# Patient Record
Sex: Male | Born: 1960 | Race: Black or African American | Hispanic: No | Marital: Married | Smoking: Former smoker
Health system: Southern US, Community
[De-identification: ages and names within clinical notes are randomized; demographics above are authoritative.]

## PROBLEM LIST (undated history)

## (undated) DIAGNOSIS — M1990 Unspecified osteoarthritis, unspecified site: Secondary | ICD-10-CM

## (undated) DIAGNOSIS — I10 Essential (primary) hypertension: Secondary | ICD-10-CM

## (undated) DIAGNOSIS — K219 Gastro-esophageal reflux disease without esophagitis: Secondary | ICD-10-CM

## (undated) DIAGNOSIS — G4730 Sleep apnea, unspecified: Secondary | ICD-10-CM

## (undated) HISTORY — PX: TRANSTHORACIC ECHOCARDIOGRAM: SHX275

## (undated) HISTORY — PX: PROSTATE SURGERY: SHX751

## (undated) HISTORY — PX: JOINT REPLACEMENT: SHX530

## (undated) HISTORY — PX: HERNIA REPAIR: SHX51

## (undated) HISTORY — PX: CARDIAC CATHETERIZATION: SHX172

## (undated) HISTORY — PX: FRACTURE SURGERY: SHX138

## (undated) HISTORY — PX: CARDIOVASCULAR STRESS TEST: SHX262

---

## 2010-02-02 HISTORY — PX: COLONOSCOPY: SHX174

## 2010-04-03 DIAGNOSIS — G4730 Sleep apnea, unspecified: Secondary | ICD-10-CM

## 2010-04-03 HISTORY — DX: Sleep apnea, unspecified: G47.30

## 2011-08-07 ENCOUNTER — Other Ambulatory Visit (HOSPITAL_COMMUNITY): Payer: Self-pay | Admitting: Orthopedic Surgery

## 2011-08-07 ENCOUNTER — Ambulatory Visit (HOSPITAL_COMMUNITY)
Admission: RE | Admit: 2011-08-07 | Discharge: 2011-08-07 | Disposition: A | Discharge status: Home / Self Care | Payer: 59 | Source: Ambulatory Visit | Attending: Orthopedic Surgery | Admitting: Orthopedic Surgery

## 2011-08-07 DIAGNOSIS — Z139 Encounter for screening, unspecified: Secondary | ICD-10-CM

## 2011-08-07 DIAGNOSIS — Z1389 Encounter for screening for other disorder: Secondary | ICD-10-CM | POA: Insufficient documentation

## 2011-09-01 ENCOUNTER — Encounter (HOSPITAL_COMMUNITY): Payer: Self-pay | Admitting: Pharmacy Technician

## 2011-09-09 NOTE — Pre-Procedure Instructions (Signed)
20 Darryl Payne  09/09/2011   Your procedure is scheduled on:  Thursday August 15  Report to [facility] at 9:30 AM.  Call this number if you have problems the morning of surgery: [phone]   Remember:   Do not eat food:After Midnight.  May have clear liquids:until Midnight .  Clear liquids include soda, tea, black coffee, apple or grape juice, broth.  Take these medicines the morning of surgery with A SIP OF WATER: Nexium (esomeprazole), Protonix (pantoprazole)   Do not wear jewelry, make-up or nail polish.  Do not wear lotions, powders, or perfumes. You may wear deodorant.  Do not shave 48 hours prior to surgery. Men may shave face and neck.  Do not bring valuables to the hospital.  Contacts, dentures or bridgework may not be worn into surgery.  Leave suitcase in the car. After surgery it may be brought to your room.  For patients admitted to the hospital, checkout time is 11:00 AM the day of discharge.   Patients discharged the day of surgery will not be allowed to drive home.  Name and phone number of your driver: family  Special Instructions: Incentive Spirometry - Practice and bring it with you on the day of surgery. and CHG Shower Use Special Wash: 1/2 bottle night before surgery and 1/2 bottle morning of surgery.   Please read over the following fact sheets that you were given: Pain Booklet, Coughing and Deep Breathing and Surgical Site Infection Prevention

## 2011-09-10 ENCOUNTER — Ambulatory Visit (HOSPITAL_COMMUNITY)
Admission: RE | Admit: 2011-09-10 | Discharge: 2011-09-10 | Disposition: A | Discharge status: Home / Self Care | Payer: 59 | Source: Ambulatory Visit | Attending: Orthopedic Surgery | Admitting: Orthopedic Surgery

## 2011-09-10 ENCOUNTER — Encounter (HOSPITAL_COMMUNITY)
Admission: RE | Admit: 2011-09-10 | Discharge: 2011-09-10 | Disposition: A | Discharge status: Home / Self Care | Payer: 59 | Source: Ambulatory Visit | Attending: Orthopedic Surgery | Admitting: Orthopedic Surgery

## 2011-09-10 ENCOUNTER — Encounter (HOSPITAL_COMMUNITY): Payer: Self-pay

## 2011-09-10 DIAGNOSIS — Z01812 Encounter for preprocedural laboratory examination: Secondary | ICD-10-CM | POA: Insufficient documentation

## 2011-09-10 DIAGNOSIS — Z01810 Encounter for preprocedural cardiovascular examination: Secondary | ICD-10-CM | POA: Insufficient documentation

## 2011-09-10 DIAGNOSIS — Z01818 Encounter for other preprocedural examination: Secondary | ICD-10-CM | POA: Insufficient documentation

## 2011-09-10 HISTORY — DX: Unspecified osteoarthritis, unspecified site: M19.90

## 2011-09-10 HISTORY — DX: Sleep apnea, unspecified: G47.30

## 2011-09-10 HISTORY — DX: Gastro-esophageal reflux disease without esophagitis: K21.9

## 2011-09-10 HISTORY — DX: Essential (primary) hypertension: I10

## 2011-09-10 LAB — BASIC METABOLIC PANEL
BUN: 11 mg/dL (ref 6–23)
CO2: 31 mEq/L (ref 19–32)
Calcium: 9.4 mg/dL (ref 8.4–10.5)
Chloride: 102 mEq/L (ref 96–112)
Creatinine, Ser: 1.07 mg/dL (ref 0.50–1.35)
GFR calc Af Amer: >90 mL/min (ref >90)
GFR calc non Af Amer: 78 mL/min — ABNORMAL LOW (ref >90)
Glucose, Bld: 94 mg/dL (ref 70–99)
Potassium: 4 mEq/L (ref 3.5–5.1)
Sodium: 141 mEq/L (ref 135–145)

## 2011-09-10 LAB — CBC
HCT: 42 % (ref 39.0–52.0)
Hemoglobin: 15.2 g/dL (ref 13.0–17.0)
MCH: 30.5 pg (ref 26.0–34.0)
MCHC: 36.2 g/dL — ABNORMAL HIGH (ref 30.0–36.0)
MCV: 84.2 fL (ref 78.0–100.0)
Platelets: 205 10*3/uL (ref 150–400)
RBC: 4.99 MIL/uL (ref 4.22–5.81)
RDW: 13.5 % (ref 11.5–15.5)
WBC: 9 10*3/uL (ref 4.0–10.5)

## 2011-09-10 LAB — SURGICAL PCR SCREEN
MRSA, PCR: NEGATIVE
Staphylococcus aureus: POSITIVE — AB

## 2011-09-10 NOTE — Progress Notes (Addendum)
Pt states had cardiac work-up ~1.5 years ago as result of being placed on too many medications. Denies MI or other cardiac hx prior to this. Requested records from [facility] in [place]: EKG, stress test, echo, cardiac cath. Also had sleep study, cannot tolerate CPAP machine.   Received back echo, cardiac cath, EKG, sleep study. No stress test received. Will leave for review by anesthesia.

## 2011-09-11 NOTE — Consult Note (Signed)
Anesthesia Chart Review:  Patient is a 51 year old male scheduled for left knee arthroscopy with debridement on 09/17/2011 by Dr. Rennis Chris. History includes hypertension, GERD, OSA, and arthritis, former smoker, prostate surgery (laser).  He had normal coronaries by cath in 2010 at [facility]) in [place], [place].  Labs acceptable.  Chest x-ray on 09/10/2011 showed no active disease.  EKG on 09/10/2011 showed sinus rhythm, cannot rule out anterior infarct (age undetermined).  He has low r waves in inferior leads III, aVF.  Overall, I think he EKG is stable since May 2012 [facility]).  Echo on 04/24/10 [facility]) showed normal LV systolic function, estimated EF 65-70%, mild LVH with grade 1 diastolic dysfunction, mildly dilated left atrium, mildly dilated right ventricle and right atrium with normal right ventricular contraction.  Cardiac cath on 03/19/08 [facility]) showed normal coronaries, normal left ventricular function, EF 60%, no significant gradient across the aortic valve.  Anticipate he can proceed as planned.  Shonna Chock, PA-C

## 2011-09-16 MED ORDER — CEFAZOLIN SODIUM-DEXTROSE 2-3 GM-% IV SOLR
2.0000 g | INTRAVENOUS | Status: DC
  Filled 2011-09-16: qty 50

## 2011-09-16 MED ORDER — CHLORHEXIDINE GLUCONATE 4 % EX LIQD: 60.0000 mL | Freq: Once | CUTANEOUS | Status: DC

## 2011-09-16 MED ORDER — LACTATED RINGERS IV SOLN
INTRAVENOUS | Status: DC
  Administered 2011-09-17: 11:00:00 via INTRAVENOUS

## 2011-09-17 ENCOUNTER — Ambulatory Visit (HOSPITAL_COMMUNITY): Payer: PRIVATE HEALTH INSURANCE | Admitting: Vascular Surgery

## 2011-09-17 ENCOUNTER — Ambulatory Visit (HOSPITAL_COMMUNITY)
Admission: RE | Admit: 2011-09-17 | Discharge: 2011-09-17 | Disposition: A | Discharge status: Home / Self Care | Payer: PRIVATE HEALTH INSURANCE | Source: Ambulatory Visit | Attending: Orthopedic Surgery | Admitting: Orthopedic Surgery

## 2011-09-17 ENCOUNTER — Encounter (HOSPITAL_COMMUNITY): Payer: Self-pay | Admitting: Vascular Surgery

## 2011-09-17 ENCOUNTER — Encounter (HOSPITAL_COMMUNITY)
Admission: RE | Disposition: A | Discharge status: Home / Self Care | Payer: Self-pay | Source: Ambulatory Visit | Attending: Orthopedic Surgery

## 2011-09-17 ENCOUNTER — Encounter (HOSPITAL_COMMUNITY): Payer: Self-pay | Admitting: *Deleted

## 2011-09-17 DIAGNOSIS — K219 Gastro-esophageal reflux disease without esophagitis: Secondary | ICD-10-CM | POA: Insufficient documentation

## 2011-09-17 DIAGNOSIS — M23305 Other meniscus derangements, unspecified medial meniscus, unspecified knee: Secondary | ICD-10-CM | POA: Insufficient documentation

## 2011-09-17 DIAGNOSIS — M2340 Loose body in knee, unspecified knee: Secondary | ICD-10-CM | POA: Insufficient documentation

## 2011-09-17 DIAGNOSIS — I10 Essential (primary) hypertension: Secondary | ICD-10-CM | POA: Insufficient documentation

## 2011-09-17 DIAGNOSIS — M2240 Chondromalacia patellae, unspecified knee: Secondary | ICD-10-CM | POA: Insufficient documentation

## 2011-09-17 DIAGNOSIS — G4730 Sleep apnea, unspecified: Secondary | ICD-10-CM | POA: Insufficient documentation

## 2011-09-17 DIAGNOSIS — M23302 Other meniscus derangements, unspecified lateral meniscus, unspecified knee: Secondary | ICD-10-CM | POA: Insufficient documentation

## 2011-09-17 HISTORY — PX: KNEE ARTHROSCOPY: SHX127

## 2011-09-17 SURGERY — ARTHROSCOPY, KNEE
Anesthesia: General | Site: Knee | Laterality: Left | Wound class: Clean

## 2011-09-17 MED ORDER — LIDOCAINE HCL (CARDIAC) 20 MG/ML IV SOLN: INTRAVENOUS | Status: DC | PRN

## 2011-09-17 MED ORDER — FENTANYL CITRATE 0.05 MG/ML IJ SOLN: 50.0000 ug | INTRAMUSCULAR | Status: DC | PRN

## 2011-09-17 MED ORDER — PROMETHAZINE HCL 25 MG/ML IJ SOLN: 6.2500 mg | INTRAMUSCULAR | Status: DC | PRN

## 2011-09-17 MED ORDER — FENTANYL CITRATE 0.05 MG/ML IJ SOLN
INTRAMUSCULAR | Status: DC | PRN
  Administered 2011-09-17: 100 ug via INTRAVENOUS
  Administered 2011-09-17: 25 ug via INTRAVENOUS

## 2011-09-17 MED ORDER — LACTATED RINGERS IV SOLN
INTRAVENOUS | Status: DC | PRN
  Administered 2011-09-17: 12:00:00 via INTRAVENOUS

## 2011-09-17 MED ORDER — CEFAZOLIN SODIUM-DEXTROSE 2-3 GM-% IV SOLR
INTRAVENOUS | Status: DC | PRN
  Administered 2011-09-17: 2 g via INTRAVENOUS

## 2011-09-17 MED ORDER — MIDAZOLAM HCL 2 MG/2ML IJ SOLN: 1.0000 mg | INTRAMUSCULAR | Status: DC | PRN

## 2011-09-17 MED ORDER — ONDANSETRON HCL 4 MG/2ML IJ SOLN
INTRAMUSCULAR | Status: DC | PRN
  Administered 2011-09-17: 4 mg via INTRAVENOUS

## 2011-09-17 MED ORDER — HYDROMORPHONE HCL PF 1 MG/ML IJ SOLN
0.2500 mg | INTRAMUSCULAR | Status: DC | PRN
  Administered 2011-09-17 (×2): 0.5 mg via INTRAVENOUS

## 2011-09-17 MED ORDER — OXYCODONE-ACETAMINOPHEN 5-325 MG PO TABS: 1.0000 | ORAL_TABLET | ORAL | Status: AC | PRN

## 2011-09-17 MED ORDER — SODIUM CHLORIDE 0.9 % IR SOLN
Status: DC | PRN
  Administered 2011-09-17: 3000 mL

## 2011-09-17 MED ORDER — PROPOFOL 10 MG/ML IV EMUL
INTRAVENOUS | Status: DC | PRN
  Administered 2011-09-17: 250 mg via INTRAVENOUS
  Administered 2011-09-17: 40 mg via INTRAVENOUS

## 2011-09-17 MED ORDER — MIDAZOLAM HCL 5 MG/5ML IJ SOLN
INTRAMUSCULAR | Status: DC | PRN
  Administered 2011-09-17: 2 mg via INTRAVENOUS

## 2011-09-17 MED ORDER — BUPIVACAINE HCL (PF) 0.25 % IJ SOLN
INTRAMUSCULAR | Status: DC | PRN
  Administered 2011-09-17: 30 mL

## 2011-09-17 MED ORDER — LIDOCAINE HCL (CARDIAC) 20 MG/ML IV SOLN
INTRAVENOUS | Status: DC | PRN
  Administered 2011-09-17: 100 mg via INTRAVENOUS

## 2011-09-17 MED ORDER — HYDROMORPHONE HCL PF 1 MG/ML IJ SOLN
INTRAMUSCULAR | Status: AC
  Filled 2011-09-17: qty 1

## 2011-09-17 MED ORDER — BUPIVACAINE HCL (PF) 0.25 % IJ SOLN
INTRAMUSCULAR | Status: AC
  Filled 2011-09-17: qty 30

## 2011-09-17 SURGICAL SUPPLY — 36 items
BANDAGE ELASTIC 6 VELCRO ST LF (GAUZE/BANDAGES/DRESSINGS) ×2 IMPLANT
BLADE CUDA 5.5 (BLADE) IMPLANT
BLADE CUTTER GATOR 3.5 (BLADE) ×2 IMPLANT
BLADE GREAT WHITE 4.2 (BLADE) ×2 IMPLANT
BOOTCOVER CLEANROOM LRG (PROTECTIVE WEAR) ×8 IMPLANT
CLOTH BEACON ORANGE TIMEOUT ST (SAFETY) ×2 IMPLANT
CLSR STERI-STRIP ANTIMIC 1/2X4 (GAUZE/BANDAGES/DRESSINGS) ×2 IMPLANT
DRAPE ARTHROSCOPY W/POUCH 114 (DRAPES) ×2 IMPLANT
DRSG PAD ABDOMINAL 8X10 ST (GAUZE/BANDAGES/DRESSINGS) IMPLANT
DURAPREP 26ML APPLICATOR (WOUND CARE) ×2 IMPLANT
GLOVE BIO SURGEON STRL SZ7.5 (GLOVE) ×2 IMPLANT
GLOVE BIO SURGEON STRL SZ8 (GLOVE) ×2 IMPLANT
GLOVE BIOGEL PI IND STRL 8.5 (GLOVE) ×2 IMPLANT
GLOVE BIOGEL PI INDICATOR 8.5 (GLOVE) ×2
GLOVE EUDERMIC 7 POWDERFREE (GLOVE) ×2 IMPLANT
GLOVE SS BIOGEL STRL SZ 7.5 (GLOVE) ×1 IMPLANT
GLOVE SUPERSENSE BIOGEL SZ 7.5 (GLOVE) ×1
GOWN STRL NON-REIN LRG LVL3 (GOWN DISPOSABLE) ×2 IMPLANT
GOWN STRL REIN XL XLG (GOWN DISPOSABLE) ×4 IMPLANT
KIT BASIN OR (CUSTOM PROCEDURE TRAY) ×2 IMPLANT
KIT ROOM TURNOVER OR (KITS) ×2 IMPLANT
MANIFOLD NEPTUNE II (INSTRUMENTS) ×2 IMPLANT
NEEDLE 18GX1X1/2 (RX/OR ONLY) (NEEDLE) ×2 IMPLANT
PACK ARTHROSCOPY DSU (CUSTOM PROCEDURE TRAY) ×2 IMPLANT
PAD ARMBOARD 7.5X6 YLW CONV (MISCELLANEOUS) ×4 IMPLANT
PADDING CAST COTTON 6X4 STRL (CAST SUPPLIES) IMPLANT
RING FOAM WHITE (MISCELLANEOUS) ×2 IMPLANT
SCOTCHCAST PLUS 4X4 WHITE (CAST SUPPLIES) ×2 IMPLANT
SET ARTHROSCOPY TUBING (MISCELLANEOUS) ×1
SET ARTHROSCOPY TUBING LN (MISCELLANEOUS) ×1 IMPLANT
SPONGE GAUZE 4X4 12PLY (GAUZE/BANDAGES/DRESSINGS) ×2 IMPLANT
SPONGE LAP 4X18 X RAY DECT (DISPOSABLE) IMPLANT
STRIP CLOSURE SKIN 1/2X4 (GAUZE/BANDAGES/DRESSINGS) ×2 IMPLANT
SYR 30ML LL (SYRINGE) ×2 IMPLANT
TOWEL OR 17X24 6PK STRL BLUE (TOWEL DISPOSABLE) ×2 IMPLANT
WATER STERILE IRR 1000ML POUR (IV SOLUTION) ×2 IMPLANT

## 2011-09-17 NOTE — Op Note (Signed)
09/17/2011  1:21 PM  PATIENT:   Darryl Payne  51 y.o. male  PRE-OPERATIVE DIAGNOSIS:  LEFT MEDIAL MENISCAL TEAR  POST-OPERATIVE DIAGNOSIS:  Left knee chondromalacia, M and L meniscal tears, loose bodies  PROCEDURE:  LKA, partial M and L menisectomies, chondroplasty, LB removal  SURGEON:  Isidor Bromell, Vania Rea M.D.  ASSISTANTS: Shuford pac   ANESTHESIA:   LMA + local  EBL: min  SPECIMEN:  none  Drains: none   PATIENT DISPOSITION:  PACU - hemodynamically stable.    PLAN OF CARE: Discharge to home after PACU  Dictation# [id number]

## 2011-09-17 NOTE — Anesthesia Preprocedure Evaluation (Addendum)
Anesthesia Evaluation  Patient identified by MRN, date of birth, ID band Patient awake    Reviewed: Allergy & Precautions, H&P , NPO status , Patient's Chart, lab work & pertinent test results  Airway Mallampati: II TM Distance: >3 FB Neck ROM: Full    Dental  (+) Teeth Intact   Pulmonary sleep apnea ,    Pulmonary exam normal       Cardiovascular hypertension,     Neuro/Psych    GI/Hepatic GERD-  Controlled and Medicated,  Endo/Other    Renal/GU      Musculoskeletal   Abdominal (+) + obese,   Peds  Hematology   Anesthesia Other Findings   Reproductive/Obstetrics                          Anesthesia Physical Anesthesia Plan  ASA: II  Anesthesia Plan: General   Post-op Pain Management:    Induction: Intravenous  Airway Management Planned: LMA  Additional Equipment:   Intra-op Plan:   Post-operative Plan: Extubation in OR  Informed Consent: I have reviewed the patients History and Physical, chart, labs and discussed the procedure including the risks, benefits and alternatives for the proposed anesthesia with the patient or authorized representative who has indicated his/her understanding and acceptance.     Plan Discussed with: CRNA and Surgeon  Anesthesia Plan Comments:         Anesthesia Quick Evaluation

## 2011-09-17 NOTE — Anesthesia Postprocedure Evaluation (Signed)
  Anesthesia Post-op Note  Patient: Darryl Payne  Procedure(s) Performed: Procedure(s) (LRB): ARTHROSCOPY KNEE (Left)  Patient Location: PACU  Anesthesia Type: General  Level of Consciousness: awake  Airway and Oxygen Therapy: Patient Spontanous Breathing  Post-op Pain: mild  Post-op Assessment: Post-op Vital signs reviewed, Patient's Cardiovascular Status Stable, Respiratory Function Stable, Patent Airway, No signs of Nausea or vomiting and Pain level controlled  Post-op Vital Signs: stable  Complications: No apparent anesthesia complications

## 2011-09-17 NOTE — H&P (Signed)
Dierdre Harness    Chief Complaint: LEFT MEDIAL MENISCAL TEAR HPI: The patient is a 51 y.o. male with persistent left knee pain refractory to conservative Rx.  Past Medical History  Diagnosis Date  . Hypertension   . Sleep apnea 04/2010    sleep study [facility], not using CPAP  . GERD (gastroesophageal reflux disease)   . Arthritis     Past Surgical History  Procedure Date  . Prostate surgery     laser   . Fracture surgery 51 yo    left   . Cardiac catheterization   . Transthoracic echocardiogram   . Cardiovascular stress test     History reviewed. No pertinent family history.  Social History:  reports that he has quit smoking. He does not have any smokeless tobacco history on file. He reports that he does not drink alcohol or use illicit drugs.  Allergies: No Known Allergies  Medications Prior to Admission  Medication Sig Dispense Refill  . Amlodipine-Valsartan-HCTZ (EXFORGE HCT) 10-320-25 MG TABS Take 1 tablet by mouth daily.      . Cholecalciferol (VITAMIN D3) 5000 UNITS TABS Take 1 tablet by mouth daily.      Marland Kitchen esomeprazole (NEXIUM) 40 MG capsule Take 40 mg by mouth daily.      . fish oil-omega-3 fatty acids 1000 MG capsule Take 1 g by mouth daily.      Marland Kitchen glucosamine-chondroitin 500-400 MG tablet Take 1 tablet by mouth daily.      Marland Kitchen ibuprofen (ADVIL,MOTRIN) 800 MG tablet Take 800 mg by mouth every 8 (eight) hours as needed.      . pantoprazole (PROTONIX) 40 MG tablet Take 40 mg by mouth daily.         Physical Exam: le5ft knee pain with exam as documented at 08/21/11 office visit  Vitals  Temp:  [97.5 F (36.4 C)] 97.5 F (36.4 C) (08/15 0853) Pulse Rate:  [76] 76  (08/15 0853) Resp:  [20] 20  (08/15 0853) BP: (136)/(85) 136/85 mmHg (08/15 0853) SpO2:  [98 %] 98 % (08/15 0853)  Assessment/Plan  Impression: LEFT MEDIAL MENISCAL TEAR  Plan of Action: Procedure(s): ARTHROSCOPY KNEE with debridement  Ailanie Ruttan M 09/17/2011, 11:52  AM

## 2011-09-17 NOTE — Transfer of Care (Signed)
Immediate Anesthesia Transfer of Care Note  Patient: Darryl Payne  Procedure(s) Performed: Procedure(s) (LRB): ARTHROSCOPY KNEE (Left)  Patient Location: PACU  Anesthesia Type: General  Level of Consciousness: sedated  Airway & Oxygen Therapy: Patient Spontanous Breathing and Patient connected to nasal cannula oxygen  Post-op Assessment: Report given to PACU RN and Post -op Vital signs reviewed and stable  Post vital signs: Reviewed and stable  Complications: No apparent anesthesia complications

## 2011-09-17 NOTE — Progress Notes (Signed)
Orthopedic Tech Progress Note Patient Details:  Darryl Payne 1960-08-30 [id number]  Ortho Devices Type of Ortho Device: Crutches Ortho Device/Splint Interventions: Application   Shawnie Pons 09/17/2011, 2:46 PM

## 2011-09-18 ENCOUNTER — Encounter (HOSPITAL_COMMUNITY): Payer: Self-pay | Admitting: Orthopedic Surgery

## 2011-09-18 NOTE — Op Note (Signed)
NAME:  Darryl Payne, Darryl Payne NO.:  [id number]  MEDICAL RECORD NO.:  [id number]  LOCATION:  [facility]                         FACILITY:  [facility]  PHYSICIAN:  Vania Rea. Kloey Cazarez, M.D.  DATE OF BIRTH:  Jun 27, 1960  DATE OF PROCEDURE:  09/17/2011 DATE OF DISCHARGE:  09/17/2011                              OPERATIVE REPORT   PREOPERATIVE DIAGNOSIS:  Left knee medial meniscus tear.  POSTOPERATIVE DIAGNOSES: 1. Left knee medial meniscus tear. 2. Left knee lateral meniscus tear. 3. 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 4. Multiple cartilaginous loose bodies.  PROCEDURE: 1. Left knee diagnostic arthroscopy. 2. Partial medial and partial lateral meniscectomies. 3. Chondroplasty of the patella. 4. Chondroplasty of the trochlear groove. 5. Chondroplasty of the medial femoral condyle. 6. Removal of multiple cartilaginous loose bodies.  SURGEON:  Vania Rea. Dereon Williamsen, M.D.  Threasa HeadsFrench Ana A. Shuford, P.A.-C.  ANESTHESIA:  LMA as well as local.  BLOOD LOSS:  Minimal.  DRAINS:  None.  TOURNIQUET:  None.  HISTORY:  Darryl Payne is a 51 year old gentleman who has had persistent left knee pain, swelling, mechanical symptoms that are refractory to prolonged attempts at conservative management.  Recent MRI scan shows evidence for tricompartmental degenerative changes, particularly in the medial compartment as well as evidence for medial meniscal tear.  Due to his ongoing pain, swelling, mechanical symptoms, and failure to respond to conservative management, he is brought to the operating room at this time for planned left knee arthroscopy as described below.  Preoperatively I counseled Mr. Mario on treatment options as well as risks versus benefits thereof.  Possible surgical complications were reviewed including potential for bleeding, infection, neurovascular injury, DVT, PE, as well as persistent pain.  We also discussed that arthroscopic surgery would  not change underlying osteoarthritis.  He understands and accepts and agrees with our planned procedure.  PROCEDURE IN DETAIL:  After undergoing routine preop evaluation, the patient received prophylactic antibiotics.  Brought to the operating room, placed supine on the operating table, underwent smooth induction of an LMA general anesthesia.  Left leg was placed in a leg holder and sterilely prepped and draped in standard fashion.  Time-out was called. Standard arthroscopy portals were established and diagnostic arthroscopy was performed.  The suprapatellar pouch and gutters showed numerous cartilaginous loose bodies and these were all evacuated with a shaver as well through the arthroscopic cannula.  The patellofemoral joint showed grade 3 chondromalacia of central patellar facet, which was debrided with shaver to stable cartilaginous base.  The trochlear groove distally showed an area of advanced chondromalacia with several areas of exposed subchondral bone across almost the entire width of the trochlear groove and loose flaps were debrided with a shaver and this area was general smoothed with the shaver affecting the chondroplasty to the entire region.  The intercondylar notch of the ACL to be intact.  Medially, there was again grade 3+ chondromalacia of the majority of the medial femoral condyle and this was all debrided with a shaver to stable cartilaginous base and there were several areas where there appeared to be some underlying subchondral bone exposed.  There was also a complex degenerative tear involving the posterior half of the  medial meniscus and again this was trimmed back to a stable margin with a basket and shaver was used for final contouring and removal of the meniscal fragments.  We also evacuated a number of cartilaginous fragments from posteromedial and posterolateral compartments.  Laterally articular surfaces were in good condition, but the lateral meniscus  showed a degenerative tear of the mid third which was debrided with shaver back to stable base.  At this point, final inspection and irrigation was then completed.  Fluid and instruments were removed.  Combination of Marcaine was instilled in the knee joint and around the portals.  Portals was closed with Steri-Strips.  Dry dressing taped at the left knee and leg was Ace bandaged support stocking.  The patient was then awakened, extubated, and taken to the recovery room in a stable condition.     Vania Rea. Nazier Neyhart, M.D.     KMS/MEDQ  D:  09/17/2011  T:  09/18/2011  Job:  [id number]

## 2011-11-19 ENCOUNTER — Ambulatory Visit: Payer: Self-pay

## 2014-06-27 DIAGNOSIS — M19011 Primary osteoarthritis, right shoulder: Secondary | ICD-10-CM | POA: Insufficient documentation

## 2015-06-18 ENCOUNTER — Encounter: Payer: Self-pay | Admitting: Family Medicine

## 2015-06-18 ENCOUNTER — Ambulatory Visit (INDEPENDENT_AMBULATORY_CARE_PROVIDER_SITE_OTHER): Payer: 59 | Admitting: Family Medicine

## 2015-06-18 VITALS — BP 120/78 | HR 80 | Ht 69.0 in | Wt 249.0 lb

## 2015-06-18 DIAGNOSIS — M159 Polyosteoarthritis, unspecified: Secondary | ICD-10-CM

## 2015-06-18 DIAGNOSIS — M150 Primary generalized (osteo)arthritis: Secondary | ICD-10-CM | POA: Diagnosis not present

## 2015-06-18 DIAGNOSIS — I10 Essential (primary) hypertension: Secondary | ICD-10-CM

## 2015-06-18 DIAGNOSIS — K219 Gastro-esophageal reflux disease without esophagitis: Secondary | ICD-10-CM | POA: Diagnosis not present

## 2015-06-18 MED ORDER — LOSARTAN POTASSIUM 50 MG PO TABS: 50.0000 mg | ORAL_TABLET | Freq: Every day | ORAL | Status: DC

## 2015-06-18 MED ORDER — PANTOPRAZOLE SODIUM 40 MG PO TBEC: 40.0000 mg | DELAYED_RELEASE_TABLET | Freq: Every day | ORAL | Status: AC

## 2015-06-18 NOTE — Progress Notes (Signed)
Name: Darryl LenisRobert L Payne   MRN: [id number]    DOB: July 31, 1960   Date:06/18/2015       Progress Note  Subjective  Chief Complaint  Chief Complaint  Patient presents with  . Establish Care  . Hypotension    "been feeling tired, so started checking B/P 107/68- stopped taking my medicine for a couple of days and monitored blood pressure." Started taking med again after 3 days and wanted to discuss it with a doctor.    HPI Comments: Patient presents for establishment with new physician.  Hypertension This is a chronic problem. The current episode started more than 1 year ago. The problem has been gradually improving since onset. The problem is controlled. Pertinent negatives include no anxiety, blurred vision, chest pain, headaches, malaise/fatigue, neck pain, orthopnea, palpitations, peripheral edema, PND, shortness of breath or sweats. There are no associated agents to hypertension. Risk factors for coronary artery disease include obesity and dyslipidemia. Past treatments include angiotensin blockers, calcium channel blockers and diuretics. The current treatment provides mild improvement. There are no compliance problems.  There is no history of angina, kidney disease, CAD/MI, CVA, heart failure, left ventricular hypertrophy, PVD, renovascular disease or retinopathy. There is no history of chronic renal disease or a hypertension causing med.    No problem-specific assessment & plan notes found for this encounter.   Past Medical History  Diagnosis Date  . Hypertension   . Sleep apnea 04/2010    [facility], not using CPAP  . GERD (gastroesophageal reflux disease)   . Arthritis     Past Surgical History  Procedure Laterality Date  . Prostate surgery      laser   . Fracture surgery  55 yo    left ankle  . Cardiac catheterization    . Transthoracic echocardiogram    . Cardiovascular stress test    . Knee arthroscopy  09/17/2011    Procedure: ARTHROSCOPY KNEE;   Surgeon: Senaida LangeKevin M Supple, MD;  Location: [facility] OR;  Service: Orthopedics;  Laterality: Left;  with Medial meniscectomy and debridement  . Colonoscopy  02/02/2010    normal/ repeat in 10 yrs- [facility]    Family History  Problem Relation Age of Onset  . Diabetes Father   . Cancer Sister   . Cancer Brother   . Diabetes Brother     Social History   Social History  . Marital Status: Married    Spouse Name: N/A  . Number of Children: N/A  . Years of Education: N/A   Occupational History  . Not on file.   Social History Main Topics  . Smoking status: Former Games developermoker  . Smokeless tobacco: Not on file  . Alcohol Use: No  . Drug Use: No  . Sexual Activity: Yes   Other Topics Concern  . Not on file   Social History Narrative    No Known Allergies   Review of Systems  Constitutional: Negative for fever, chills, weight loss and malaise/fatigue.  HENT: Negative for ear discharge, ear pain and sore throat.   Eyes: Negative for blurred vision.  Respiratory: Negative for cough, sputum production, shortness of breath and wheezing.   Cardiovascular: Negative for chest pain, palpitations, orthopnea, leg swelling and PND.  Gastrointestinal: Negative for heartburn, nausea, abdominal pain, diarrhea, constipation, blood in stool and melena.  Genitourinary: Negative for dysuria, urgency, frequency and hematuria.  Musculoskeletal: Negative for myalgias, back pain, joint pain and neck pain.  Skin: Negative for rash.  Neurological: Negative for  dizziness, tingling, sensory change, focal weakness and headaches.  Endo/Heme/Allergies: Negative for environmental allergies and polydipsia. Does not bruise/bleed easily.  Psychiatric/Behavioral: Negative for depression and suicidal ideas. The patient is not nervous/anxious and does not have insomnia.      Objective  Filed Vitals:   06/18/15 1434  BP: 120/78  Pulse: 80  Height:  (1.753 m)  Weight: 249 lb (112.946 kg)    Physical Exam   Constitutional: He is oriented to person, place, and time and well-developed, well-nourished, and in no distress.  HENT:  Head: Normocephalic.  Right Ear: External ear normal.  Left Ear: External ear normal.  Nose: Nose normal.  Mouth/Throat: Oropharynx is clear and moist.  Eyes: Conjunctivae and EOM are normal. Pupils are equal, round, and reactive to light. Right eye exhibits no discharge. Left eye exhibits no discharge. No scleral icterus.  Neck: Normal range of motion. Neck supple. No JVD present. No tracheal deviation present. No thyromegaly present.  Cardiovascular: Normal rate, regular rhythm, normal heart sounds and intact distal pulses.  Exam reveals no gallop and no friction rub.   No murmur heard. Pulmonary/Chest: Breath sounds normal. No respiratory distress. He has no wheezes. He has no rales.  Abdominal: Soft. Bowel sounds are normal. He exhibits no mass. There is no hepatosplenomegaly. There is no tenderness. There is no rebound, no guarding and no CVA tenderness.  Musculoskeletal: Normal range of motion. He exhibits no edema or tenderness.  Lymphadenopathy:    He has no cervical adenopathy.  Neurological: He is alert and oriented to person, place, and time. He has normal sensation, normal strength and intact cranial nerves. No cranial nerve deficit.  Skin: Skin is warm. No rash noted.  Psychiatric: Mood and affect normal.  Nursing note and vitals reviewed.     Assessment & Plan  Problem List Items Addressed This Visit    None    Visit Diagnoses    Essential hypertension    -  Primary    Relevant Medications    losartan (COZAAR) 50 MG tablet    Primary osteoarthritis involving multiple joints        aleve bid    Gastroesophageal reflux disease, esophagitis presence not specified        Relevant Medications    pantoprazole (PROTONIX) 40 MG tablet         Dr. Hayden [facility]  06/18/2015

## 2015-07-25 ENCOUNTER — Other Ambulatory Visit: Payer: Self-pay

## 2015-07-25 MED ORDER — AMLODIPINE-VALSARTAN-HCTZ 10-320-25 MG PO TABS: 1.0000 | ORAL_TABLET | Freq: Every day | ORAL | Status: DC

## 2015-07-25 MED ORDER — MELOXICAM 7.5 MG PO TABS: 7.5000 mg | ORAL_TABLET | Freq: Two times a day (BID) | ORAL | Status: DC

## 2015-07-30 ENCOUNTER — Other Ambulatory Visit: Payer: Self-pay

## 2015-08-29 ENCOUNTER — Other Ambulatory Visit: Payer: Self-pay

## 2015-08-29 DIAGNOSIS — I10 Essential (primary) hypertension: Secondary | ICD-10-CM

## 2015-08-29 MED ORDER — AMLODIPINE-VALSARTAN-HCTZ 10-320-25 MG PO TABS: 1.0000 | ORAL_TABLET | Freq: Every day | ORAL | 1 refills | Status: DC

## 2015-11-01 ENCOUNTER — Other Ambulatory Visit: Payer: Self-pay

## 2015-12-19 ENCOUNTER — Encounter: Payer: 59 | Admitting: Family Medicine

## 2016-03-11 ENCOUNTER — Other Ambulatory Visit: Payer: Self-pay | Admitting: Orthopedic Surgery

## 2016-04-22 NOTE — Pre-Procedure Instructions (Signed)
Darryl LenisRobert L Payne  04/22/2016      [id number] [place] [address] [place] [id number] Phone: [phone] Fax: [phone]    Your procedure is scheduled on  Thursday  04/30/16  [facility].M[facility]  Call this number if you have problems the morning of surgery:  [phone]   Remember:  Do not eat food or drink liquids after midnight.  Take these medicines the morning of surgery with A SIP OF WATER   PANTOPRAZOLE (PROTONIX)          (STOP NOW TAKING ASPIRIN OR ASPIRIN PRODUCTS, IBUPROFEN/ ADVIL/ MOTRIN, GOODY POWDERS, BC'S, FISH OIL , FLAXSEED, GINKO BILOBA, MELOXICAM/ MOBIC, MULTIVITAMIN, HERBAL MEDICINES)   Do not wear jewelry, make-up or nail polish.  Do not wear lotions, powders, or perfumes, or deoderant.  Do not shave 48 hours prior to surgery.  Men may shave face and neck.  Do not bring valuables to the hospital.  [facility] is not [facility] for any belongings or valuables.  Contacts, dentures or bridgework may not be worn into surgery.  Leave your suitcase in the car.  After surgery it may be brought to your room.  For patients admitted to the hospital, discharge time will be determined by your treatment team.  Patients discharged the day of surgery will not be allowed to drive home.   Name and phone number of your driver:    Special instructions:  [place] - Preparing for Surgery  Before surgery, you can play an important role.  Because skin is not sterile, your skin needs to be as free of germs as possible.  You can reduce the number of germs on you skin by washing with CHG (chlorahexidine gluconate) soap before surgery.  CHG is an antiseptic cleaner which kills germs and bonds with the skin to continue killing germs even after washing.  Please DO NOT use if you have an allergy to CHG or antibacterial soaps.  If your skin becomes reddened/irritated stop using the CHG and inform  your nurse when you arrive at Short Stay.  Do not shave (including legs and underarms) for at least 48 hours prior to the first CHG shower.  You may shave your face.  Please follow these instructions carefully:   1.  Shower with CHG Soap the night before surgery and the                                morning of Surgery.  2.  If you choose to wash your hair, wash your hair first as usual with your       normal shampoo.  3.  After you shampoo, rinse your hair and body thoroughly to remove the                      Shampoo.  4.  Use CHG as you would any other liquid soap.  You can apply chg directly       to the skin and wash gently with scrungie or a clean washcloth.  5.  Apply the CHG Soap to your body ONLY FROM THE NECK DOWN.        Do not use on open wounds or open sores.  Avoid contact with your eyes,       ears, mouth and genitals (private parts).  Wash genitals (  private parts)       with your normal soap.  6.  Wash thoroughly, paying special attention to the area where your surgery        will be performed.  7.  Thoroughly rinse your body with warm water from the neck down.  8.  DO NOT shower/wash with your normal soap after using and rinsing off       the CHG Soap.  9.  Pat yourself dry with a clean towel.            10.  Wear clean pajamas.            11.  Place clean sheets on your bed the night of your first shower and do not        sleep with pets.  Day of Surgery  Do not apply any lotions/deoderants the morning of surgery.  Please wear clean clothes to the hospital/surgery center.    Please read over the following fact sheets that you were given. MRSA Information and Surgical Site Infection Prevention

## 2016-04-23 ENCOUNTER — Encounter (HOSPITAL_COMMUNITY)
Admission: RE | Admit: 2016-04-23 | Discharge: 2016-04-23 | Disposition: A | Discharge status: Home / Self Care | Payer: 59 | Source: Ambulatory Visit | Attending: Orthopedic Surgery | Admitting: Orthopedic Surgery

## 2016-04-23 ENCOUNTER — Ambulatory Visit (HOSPITAL_COMMUNITY)
Admission: RE | Admit: 2016-04-23 | Discharge: 2016-04-23 | Disposition: A | Discharge status: Home / Self Care | Payer: 59 | Source: Ambulatory Visit | Attending: Orthopedic Surgery | Admitting: Orthopedic Surgery

## 2016-04-23 ENCOUNTER — Encounter (HOSPITAL_COMMUNITY): Admission: RE | Admit: 2016-04-23 | Payer: 59 | Source: Ambulatory Visit

## 2016-04-23 ENCOUNTER — Encounter (HOSPITAL_COMMUNITY): Payer: Self-pay

## 2016-04-23 DIAGNOSIS — R918 Other nonspecific abnormal finding of lung field: Secondary | ICD-10-CM | POA: Insufficient documentation

## 2016-04-23 DIAGNOSIS — I517 Cardiomegaly: Secondary | ICD-10-CM | POA: Insufficient documentation

## 2016-04-23 DIAGNOSIS — Z01812 Encounter for preprocedural laboratory examination: Secondary | ICD-10-CM | POA: Diagnosis not present

## 2016-04-23 DIAGNOSIS — M19012 Primary osteoarthritis, left shoulder: Secondary | ICD-10-CM | POA: Insufficient documentation

## 2016-04-23 DIAGNOSIS — Z01818 Encounter for other preprocedural examination: Secondary | ICD-10-CM | POA: Diagnosis not present

## 2016-04-23 HISTORY — DX: Unspecified osteoarthritis, unspecified site: M19.90

## 2016-04-23 LAB — COMPREHENSIVE METABOLIC PANEL
ALT: 18 U/L (ref 17–63)
AST: 22 U/L (ref 15–41)
Albumin: 4 g/dL (ref 3.5–5.0)
Alkaline Phosphatase: 63 U/L (ref 38–126)
Anion gap: 8 (ref 5–15)
BUN: 15 mg/dL (ref 6–20)
CO2: 27 mmol/L (ref 22–32)
Calcium: 9.2 mg/dL (ref 8.9–10.3)
Chloride: 103 mmol/L (ref 101–111)
Creatinine, Ser: 1.03 mg/dL (ref 0.61–1.24)
GFR calc Af Amer: >60 mL/min (ref >60)
GFR calc non Af Amer: >60 mL/min (ref >60)
Glucose, Bld: 94 mg/dL (ref 65–99)
Potassium: 3.5 mmol/L (ref 3.5–5.1)
Sodium: 138 mmol/L (ref 135–145)
Total Bilirubin: 0.6 mg/dL (ref 0.3–1.2)
Total Protein: 7.9 g/dL (ref 6.5–8.1)

## 2016-04-23 LAB — CBC WITH DIFFERENTIAL/PLATELET
Basophils Absolute: 0 10*3/uL (ref 0.0–0.1)
Basophils Relative: 0 %
Eosinophils Absolute: 0.2 10*3/uL (ref 0.0–0.7)
Eosinophils Relative: 2 %
HCT: 43.9 % (ref 39.0–52.0)
Hemoglobin: 15.2 g/dL (ref 13.0–17.0)
Lymphocytes Relative: 23 %
Lymphs Abs: 2.1 10*3/uL (ref 0.7–4.0)
MCH: 29.7 pg (ref 26.0–34.0)
MCHC: 34.6 g/dL (ref 30.0–36.0)
MCV: 85.9 fL (ref 78.0–100.0)
Monocytes Absolute: 0.7 10*3/uL (ref 0.1–1.0)
Monocytes Relative: 8 %
Neutro Abs: 6.1 10*3/uL (ref 1.7–7.7)
Neutrophils Relative %: 67 %
Platelets: 193 10*3/uL (ref 150–400)
RBC: 5.11 MIL/uL (ref 4.22–5.81)
RDW: 14.2 % (ref 11.5–15.5)
WBC: 9.2 10*3/uL (ref 4.0–10.5)

## 2016-04-23 LAB — URINALYSIS, ROUTINE W REFLEX MICROSCOPIC
Bacteria, UA: NONE SEEN
Bilirubin Urine: NEGATIVE
Glucose, UA: NEGATIVE mg/dL
Ketones, ur: NEGATIVE mg/dL
Leukocytes, UA: NEGATIVE
Nitrite: NEGATIVE
Protein, ur: NEGATIVE mg/dL
Specific Gravity, Urine: 1.019 (ref 1.005–1.030)
Squamous Epithelial / LPF: NONE SEEN
pH: 5 (ref 5.0–8.0)

## 2016-04-23 LAB — PROTIME-INR
INR: 1.01
Prothrombin Time: 13.3 seconds (ref 11.4–15.2)

## 2016-04-23 LAB — APTT: aPTT: 27 seconds (ref 24–36)

## 2016-04-23 LAB — SURGICAL PCR SCREEN
MRSA, PCR: NEGATIVE
Staphylococcus aureus: POSITIVE — AB

## 2016-04-23 NOTE — Progress Notes (Signed)
Pt denies SOB, chest pain, and being under the care of a cardiologist. Pt stated that a stress test was performed > 5 years ago. Pt denies having an EKG and chest ay within the last year. Pt denies recent labs. Pt chart forwarded to anesthesia to review chest x ray.

## 2016-04-27 ENCOUNTER — Encounter (HOSPITAL_COMMUNITY): Payer: Self-pay

## 2016-04-27 NOTE — Progress Notes (Addendum)
Anesthesia chart review: Patient is a 56 year old male scheduled for left shoulder arthroplasty on 04/30/16 by Dr. Ave Filterhandler.  History includes former smoker, hypertension, GERD, arthritis, OSA (no CPAP), prostate surgery (laser), left knee arthroscopy '13. He had normal coronaries by [facility] in [place], [place]. BMI is consistent with obesity.   He reports PCP as Dr. Clovis RileyMitchell with [facility].   Meds include amlodipine-valsartan-HCTZ, vitamin D, fish oil, flaxseed oil, ginkgo biloba, Protonix, sildenafil (for ED).  BP 139/81   Pulse 66   Temp 36.4 C   Resp 20   Ht 5\' 9"  (1.753 m)   Wt 242 lb 9.6 oz (110 kg)   SpO2 97%   BMI 35.83 kg/m   EKG 04/23/2016: Normal sinus rhythm. Q waves in lead III.  Echo 04/24/10 [facility]; scanned under Media tab, Correspondence, encounter 09/17/11): Impression: 1. Normal LV systolic function, estimated EF 65-70%.  2. Mild LVH with grade 1 diastolic dysfunction, mildly dilated left atrium. 3. Mildly dilated right ventricle and right atrium with normal right ventricular contraction.  Cardiac cath 03/19/08 (Dr. Av[facility]lu[facility]Mahmoud Atieh[facility]; scanned under Media tab, Correspondence, encounter 09/17/11): Impression: 1. Normal coronaries. 2. Normal left ventricular function, EF 60%. 3. No significant gradient across the aortic valve. Recommendations: Continue with risk factor modification and medical therapy.  Sleep Study 05/24/10 [facility]; scanned under Media tab, Correspondence, encounter 09/17/11): Impression: There is overnight sleep study documented a moderate to severe obstructive sleep apnea syndrome and hypoventilation. Patient could not tolerate CPAP or BiPAP titration due to difficulty with exhalation, claustrophobia, etc.  Chest x-ray 04/23/2016: FINDINGS: Mediastinum hilar structures normal. Cardiomegaly. Mild bilateral interstitial prominence. No pleural effusion or pneumothorax. Degenerative changes thoracic  spine. IMPRESSION: Cardiomegaly. Mild bilateral interstitial prominence. Mild CHF cannot be excluded.  Preoperative labs noted. CMET, CBC, PT/PTT within normal limits. UA small hemoglobin, negative for leukocytes and nitrites.  Patient denied chest pain and SOB at PAT. I also called and spoke with him. He denied SOB, edema, recent respiratory infection. I was not asked to evaluate patient during his PAT visit, but subjectively no symptoms concerning for CHF or acute respiratory illness. If no acute changes then I anticipate that he can proceed as planned. I will route CXR report to Dr. Clovis RileyMitchell for his records. Of note, patient indicated that he did not want narcotic pain medication. I advised that he let Dr. Ave Filterhandler know. He will speak with Dr. Ave Filterhandler and his anesthesiologist on the morning of surgery.   Velna Ochsllison Zelenak, PA-C [facility]Anesthesiology Phone [phone] 04/27/2016 10:49 AM

## 2016-04-29 NOTE — Anesthesia Preprocedure Evaluation (Addendum)
Anesthesia Evaluation  Patient identified by MRN, date of birth, ID band Patient awake    Reviewed: Allergy & Precautions, NPO status , Patient's Chart, lab work & pertinent test results  History of Anesthesia Complications Negative for: history of anesthetic complications  Airway Mallampati: II   Neck ROM: Full    Dental no notable dental hx. (+) Dental Advisory Given   Pulmonary sleep apnea , former smoker,    Pulmonary exam normal        Cardiovascular hypertension, Normal cardiovascular exam     Neuro/Psych negative neurological ROS  negative psych ROS   GI/Hepatic Neg liver ROS, GERD  ,  Endo/Other  negative endocrine ROSMorbid obesity  Renal/GU negative Renal ROS     Musculoskeletal negative musculoskeletal ROS (+)   Abdominal   Peds  Hematology negative hematology ROS (+)   Anesthesia Other Findings Day of surgery medications reviewed with the patient.  Reproductive/Obstetrics                            Anesthesia Physical Anesthesia Plan  ASA: III  Anesthesia Plan: General   Post-op Pain Management: GA combined w/ Regional for post-op pain   Induction:   Airway Management Planned: Oral ETT  Additional Equipment:   Intra-op Plan:   Post-operative Plan: Extubation in OR  Informed Consent: I have reviewed the patients History and Physical, chart, labs and discussed the procedure including the risks, benefits and alternatives for the proposed anesthesia with the patient or authorized representative who has indicated his/her understanding and acceptance.   Dental advisory given  Plan Discussed with: CRNA, Anesthesiologist and Surgeon  Anesthesia Plan Comments:        Anesthesia Quick Evaluation

## 2016-04-30 ENCOUNTER — Inpatient Hospital Stay (HOSPITAL_COMMUNITY): Payer: 59 | Admitting: Anesthesiology

## 2016-04-30 ENCOUNTER — Inpatient Hospital Stay (HOSPITAL_COMMUNITY)
Admission: RE | Admit: 2016-04-30 | Discharge: 2016-05-01 | DRG: 483 | Disposition: A | Discharge status: Home / Self Care | Payer: 59 | Source: Ambulatory Visit | Attending: Orthopedic Surgery | Admitting: Orthopedic Surgery

## 2016-04-30 ENCOUNTER — Encounter (HOSPITAL_COMMUNITY): Payer: Self-pay | Admitting: Urology

## 2016-04-30 ENCOUNTER — Encounter (HOSPITAL_COMMUNITY)
Admission: RE | Disposition: A | Discharge status: Home / Self Care | Payer: Self-pay | Source: Ambulatory Visit | Attending: Orthopedic Surgery

## 2016-04-30 ENCOUNTER — Inpatient Hospital Stay (HOSPITAL_COMMUNITY): Payer: 59 | Admitting: Emergency Medicine

## 2016-04-30 ENCOUNTER — Inpatient Hospital Stay (HOSPITAL_COMMUNITY): Payer: 59

## 2016-04-30 DIAGNOSIS — I10 Essential (primary) hypertension: Secondary | ICD-10-CM | POA: Diagnosis present

## 2016-04-30 DIAGNOSIS — Z87891 Personal history of nicotine dependence: Secondary | ICD-10-CM

## 2016-04-30 DIAGNOSIS — K219 Gastro-esophageal reflux disease without esophagitis: Secondary | ICD-10-CM | POA: Diagnosis present

## 2016-04-30 DIAGNOSIS — Z885 Allergy status to narcotic agent status: Secondary | ICD-10-CM

## 2016-04-30 DIAGNOSIS — E876 Hypokalemia: Secondary | ICD-10-CM | POA: Diagnosis not present

## 2016-04-30 DIAGNOSIS — M19012 Primary osteoarthritis, left shoulder: Secondary | ICD-10-CM | POA: Diagnosis present

## 2016-04-30 DIAGNOSIS — Z96612 Presence of left artificial shoulder joint: Secondary | ICD-10-CM

## 2016-04-30 DIAGNOSIS — Z833 Family history of diabetes mellitus: Secondary | ICD-10-CM

## 2016-04-30 DIAGNOSIS — G4730 Sleep apnea, unspecified: Secondary | ICD-10-CM | POA: Diagnosis present

## 2016-04-30 DIAGNOSIS — Z79899 Other long term (current) drug therapy: Secondary | ICD-10-CM | POA: Diagnosis not present

## 2016-04-30 HISTORY — PX: TOTAL SHOULDER ARTHROPLASTY: SHX126

## 2016-04-30 SURGERY — ARTHROPLASTY, SHOULDER, TOTAL
Anesthesia: Regional | Laterality: Left

## 2016-04-30 MED ORDER — BUPIVACAINE LIPOSOME 1.3 % IJ SUSP
20.0000 mL | INTRAMUSCULAR | Status: DC
  Filled 2016-04-30: qty 20

## 2016-04-30 MED ORDER — SUCCINYLCHOLINE CHLORIDE 200 MG/10ML IV SOSY
PREFILLED_SYRINGE | INTRAVENOUS | Status: AC
  Filled 2016-04-30: qty 10

## 2016-04-30 MED ORDER — HYDROMORPHONE HCL 1 MG/ML IJ SOLN
INTRAMUSCULAR | Status: AC
  Administered 2016-04-30: 0.5 mg via INTRAVENOUS
  Filled 2016-04-30: qty 0.5

## 2016-04-30 MED ORDER — POLYETHYLENE GLYCOL 3350 17 G PO PACK: 17.0000 g | PACK | Freq: Every day | ORAL | Status: DC | PRN

## 2016-04-30 MED ORDER — ONDANSETRON HCL 4 MG/2ML IJ SOLN
4.0000 mg | Freq: Four times a day (QID) | INTRAMUSCULAR | Status: DC | PRN
  Administered 2016-04-30: 4 mg via INTRAVENOUS
  Filled 2016-04-30: qty 2

## 2016-04-30 MED ORDER — BUPIVACAINE-EPINEPHRINE (PF) 0.5% -1:200000 IJ SOLN
INTRAMUSCULAR | Status: DC | PRN
  Administered 2016-04-30: 30 mL via PERINEURAL

## 2016-04-30 MED ORDER — HYDROMORPHONE HCL 1 MG/ML IJ SOLN
INTRAMUSCULAR | Status: AC
  Filled 2016-04-30: qty 0.5

## 2016-04-30 MED ORDER — ACETAMINOPHEN 650 MG RE SUPP: 650.0000 mg | Freq: Four times a day (QID) | RECTAL | Status: DC | PRN

## 2016-04-30 MED ORDER — PHENOL 1.4 % MT LIQD: 1.0000 | OROMUCOSAL | Status: DC | PRN

## 2016-04-30 MED ORDER — AMLODIPINE-VALSARTAN-HCTZ 10-320-25 MG PO TABS: 1.0000 | ORAL_TABLET | Freq: Every day | ORAL | Status: DC

## 2016-04-30 MED ORDER — PHENYLEPHRINE HCL 10 MG/ML IJ SOLN
INTRAMUSCULAR | Status: DC | PRN
  Administered 2016-04-30: 80 ug via INTRAVENOUS
  Administered 2016-04-30: 200 ug via INTRAVENOUS
  Administered 2016-04-30: 120 ug via INTRAVENOUS

## 2016-04-30 MED ORDER — ONDANSETRON HCL 4 MG PO TABS: 4.0000 mg | ORAL_TABLET | Freq: Four times a day (QID) | ORAL | Status: DC | PRN

## 2016-04-30 MED ORDER — MIDAZOLAM HCL 2 MG/2ML IJ SOLN
INTRAMUSCULAR | Status: AC
  Filled 2016-04-30: qty 2

## 2016-04-30 MED ORDER — PROPOFOL 10 MG/ML IV BOLUS
INTRAVENOUS | Status: DC | PRN
  Administered 2016-04-30: 200 mg via INTRAVENOUS
  Administered 2016-04-30: 20 mg via INTRAVENOUS

## 2016-04-30 MED ORDER — METOCLOPRAMIDE HCL 5 MG/ML IJ SOLN
5.0000 mg | Freq: Three times a day (TID) | INTRAMUSCULAR | Status: DC | PRN
  Filled 2016-04-30: qty 2

## 2016-04-30 MED ORDER — ONDANSETRON HCL 4 MG/2ML IJ SOLN
INTRAMUSCULAR | Status: DC | PRN
  Administered 2016-04-30: 4 mg via INTRAVENOUS

## 2016-04-30 MED ORDER — MORPHINE SULFATE (PF) 2 MG/ML IV SOLN
1.0000 mg | INTRAVENOUS | Status: DC | PRN
  Administered 2016-04-30: 2 mg via INTRAVENOUS
  Filled 2016-04-30: qty 1

## 2016-04-30 MED ORDER — OXYCODONE-ACETAMINOPHEN 5-325 MG PO TABS: 1.0000 | ORAL_TABLET | ORAL | 0 refills | Status: DC | PRN

## 2016-04-30 MED ORDER — ALUM & MAG HYDROXIDE-SIMETH 200-200-20 MG/5ML PO SUSP: 30.0000 mL | ORAL | Status: DC | PRN

## 2016-04-30 MED ORDER — PHENYLEPHRINE 40 MCG/ML (10ML) SYRINGE FOR IV PUSH (FOR BLOOD PRESSURE SUPPORT)
PREFILLED_SYRINGE | INTRAVENOUS | Status: AC
  Filled 2016-04-30: qty 10

## 2016-04-30 MED ORDER — LACTATED RINGERS IV SOLN
INTRAVENOUS | Status: DC | PRN
  Administered 2016-04-30 (×2): via INTRAVENOUS

## 2016-04-30 MED ORDER — METHOCARBAMOL 500 MG PO TABS
ORAL_TABLET | ORAL | Status: AC
  Administered 2016-04-30: 500 mg via ORAL
  Filled 2016-04-30: qty 1

## 2016-04-30 MED ORDER — SODIUM CHLORIDE 0.9% FLUSH
INTRAVENOUS | Status: DC | PRN
  Administered 2016-04-30: 10 mL

## 2016-04-30 MED ORDER — ALBUTEROL SULFATE HFA 108 (90 BASE) MCG/ACT IN AERS
INHALATION_SPRAY | RESPIRATORY_TRACT | Status: DC | PRN
  Administered 2016-04-30: 2 via RESPIRATORY_TRACT

## 2016-04-30 MED ORDER — METHOCARBAMOL 1000 MG/10ML IJ SOLN
500.0000 mg | Freq: Four times a day (QID) | INTRAVENOUS | Status: DC | PRN
  Filled 2016-04-30: qty 5

## 2016-04-30 MED ORDER — FENTANYL CITRATE (PF) 250 MCG/5ML IJ SOLN
INTRAMUSCULAR | Status: AC
  Filled 2016-04-30: qty 5

## 2016-04-30 MED ORDER — OXYCODONE HCL 5 MG PO TABS
ORAL_TABLET | ORAL | Status: AC
  Administered 2016-04-30: 10 mg via ORAL
  Filled 2016-04-30: qty 2

## 2016-04-30 MED ORDER — METOCLOPRAMIDE HCL 5 MG PO TABS: 5.0000 mg | ORAL_TABLET | Freq: Three times a day (TID) | ORAL | Status: DC | PRN

## 2016-04-30 MED ORDER — ROCURONIUM BROMIDE 50 MG/5ML IV SOSY
PREFILLED_SYRINGE | INTRAVENOUS | Status: AC
  Filled 2016-04-30: qty 5

## 2016-04-30 MED ORDER — ASPIRIN EC 325 MG PO TBEC
325.0000 mg | DELAYED_RELEASE_TABLET | Freq: Every day | ORAL | Status: DC
  Administered 2016-04-30 – 2016-05-01 (×2): 325 mg via ORAL
  Filled 2016-04-30 (×2): qty 1

## 2016-04-30 MED ORDER — METHOCARBAMOL 500 MG PO TABS
500.0000 mg | ORAL_TABLET | Freq: Four times a day (QID) | ORAL | Status: DC | PRN
  Administered 2016-04-30 (×2): 500 mg via ORAL
  Filled 2016-04-30: qty 1

## 2016-04-30 MED ORDER — AMLODIPINE BESYLATE 10 MG PO TABS
10.0000 mg | ORAL_TABLET | Freq: Every day | ORAL | Status: DC
  Administered 2016-04-30 – 2016-05-01 (×2): 10 mg via ORAL
  Filled 2016-04-30 (×2): qty 1

## 2016-04-30 MED ORDER — 0.9 % SODIUM CHLORIDE (POUR BTL) OPTIME
TOPICAL | Status: DC | PRN
  Administered 2016-04-30: 1000 mL

## 2016-04-30 MED ORDER — DOCUSATE SODIUM 100 MG PO CAPS
100.0000 mg | ORAL_CAPSULE | Freq: Two times a day (BID) | ORAL | Status: DC
  Administered 2016-04-30 – 2016-05-01 (×2): 100 mg via ORAL
  Filled 2016-04-30 (×2): qty 1

## 2016-04-30 MED ORDER — DOCUSATE SODIUM 100 MG PO CAPS: 100.0000 mg | ORAL_CAPSULE | Freq: Three times a day (TID) | ORAL | 0 refills | Status: DC | PRN

## 2016-04-30 MED ORDER — DIPHENHYDRAMINE HCL 12.5 MG/5ML PO ELIX
12.5000 mg | ORAL_SOLUTION | ORAL | Status: DC | PRN
  Administered 2016-05-01 (×2): 12.5 mg via ORAL
  Filled 2016-04-30 (×2): qty 10

## 2016-04-30 MED ORDER — HYDROCHLOROTHIAZIDE 25 MG PO TABS
25.0000 mg | ORAL_TABLET | Freq: Every day | ORAL | Status: DC
  Administered 2016-04-30 – 2016-05-01 (×2): 25 mg via ORAL
  Filled 2016-04-30 (×2): qty 1

## 2016-04-30 MED ORDER — FENTANYL CITRATE (PF) 100 MCG/2ML IJ SOLN
INTRAMUSCULAR | Status: DC | PRN
  Administered 2016-04-30: 100 ug via INTRAVENOUS
  Administered 2016-04-30 (×2): 25 ug via INTRAVENOUS
  Administered 2016-04-30: 50 ug via INTRAVENOUS

## 2016-04-30 MED ORDER — ONDANSETRON HCL 4 MG/2ML IJ SOLN
INTRAMUSCULAR | Status: AC
  Filled 2016-04-30: qty 2

## 2016-04-30 MED ORDER — PHENYLEPHRINE HCL 10 MG/ML IJ SOLN
INTRAVENOUS | Status: DC | PRN
  Administered 2016-04-30: 30 ug/min via INTRAVENOUS

## 2016-04-30 MED ORDER — TRANEXAMIC ACID 1000 MG/10ML IV SOLN
1000.0000 mg | INTRAVENOUS | Status: AC
  Administered 2016-04-30: 1000 mg via INTRAVENOUS
  Filled 2016-04-30: qty 10

## 2016-04-30 MED ORDER — EPHEDRINE 5 MG/ML INJ
INTRAVENOUS | Status: AC
  Filled 2016-04-30: qty 10

## 2016-04-30 MED ORDER — ACETAMINOPHEN 500 MG PO TABS
1000.0000 mg | ORAL_TABLET | Freq: Four times a day (QID) | ORAL | Status: DC
  Administered 2016-04-30 – 2016-05-01 (×3): 1000 mg via ORAL
  Filled 2016-04-30 (×3): qty 2

## 2016-04-30 MED ORDER — CEFAZOLIN SODIUM-DEXTROSE 2-4 GM/100ML-% IV SOLN
2.0000 g | INTRAVENOUS | Status: AC
  Administered 2016-04-30: 2 g via INTRAVENOUS
  Filled 2016-04-30: qty 100

## 2016-04-30 MED ORDER — MIDAZOLAM HCL 5 MG/5ML IJ SOLN
INTRAMUSCULAR | Status: DC | PRN
  Administered 2016-04-30: 2 mg via INTRAVENOUS

## 2016-04-30 MED ORDER — PROMETHAZINE HCL 25 MG/ML IJ SOLN: 6.2500 mg | INTRAMUSCULAR | Status: DC | PRN

## 2016-04-30 MED ORDER — LIDOCAINE 2% (20 MG/ML) 5 ML SYRINGE
INTRAMUSCULAR | Status: AC
  Filled 2016-04-30: qty 5

## 2016-04-30 MED ORDER — EPHEDRINE SULFATE 50 MG/ML IJ SOLN
INTRAMUSCULAR | Status: DC | PRN
  Administered 2016-04-30: 5 mg via INTRAVENOUS

## 2016-04-30 MED ORDER — SODIUM CHLORIDE 0.9 % IR SOLN
Status: DC | PRN
Start: 2016-04-30 — End: 2016-04-30
  Administered 2016-04-30: 3000 mL

## 2016-04-30 MED ORDER — MENTHOL 3 MG MT LOZG: 1.0000 | LOZENGE | OROMUCOSAL | Status: DC | PRN

## 2016-04-30 MED ORDER — CEFAZOLIN SODIUM-DEXTROSE 2-4 GM/100ML-% IV SOLN
2.0000 g | Freq: Four times a day (QID) | INTRAVENOUS | Status: AC
  Administered 2016-04-30 – 2016-05-01 (×3): 2 g via INTRAVENOUS
  Filled 2016-04-30 (×3): qty 100

## 2016-04-30 MED ORDER — BUPIVACAINE LIPOSOME 1.3 % IJ SUSP
INTRAMUSCULAR | Status: DC | PRN
  Administered 2016-04-30: 20 mL

## 2016-04-30 MED ORDER — SUGAMMADEX SODIUM 200 MG/2ML IV SOLN
INTRAVENOUS | Status: AC
  Filled 2016-04-30: qty 2

## 2016-04-30 MED ORDER — PANTOPRAZOLE SODIUM 40 MG PO TBEC
40.0000 mg | DELAYED_RELEASE_TABLET | Freq: Two times a day (BID) | ORAL | Status: DC
  Administered 2016-04-30 – 2016-05-01 (×3): 40 mg via ORAL
  Filled 2016-04-30 (×3): qty 1

## 2016-04-30 MED ORDER — PROPOFOL 10 MG/ML IV BOLUS
INTRAVENOUS | Status: AC
  Filled 2016-04-30: qty 20

## 2016-04-30 MED ORDER — SODIUM CHLORIDE 0.9 % IV SOLN: INTRAVENOUS | Status: DC

## 2016-04-30 MED ORDER — SUCCINYLCHOLINE CHLORIDE 20 MG/ML IJ SOLN
INTRAMUSCULAR | Status: DC | PRN
  Administered 2016-04-30: 160 mg via INTRAVENOUS

## 2016-04-30 MED ORDER — ROCURONIUM BROMIDE 100 MG/10ML IV SOLN
INTRAVENOUS | Status: DC | PRN
  Administered 2016-04-30: 50 mg via INTRAVENOUS

## 2016-04-30 MED ORDER — BISACODYL 5 MG PO TBEC: 5.0000 mg | DELAYED_RELEASE_TABLET | Freq: Every day | ORAL | Status: DC | PRN

## 2016-04-30 MED ORDER — POVIDONE-IODINE 7.5 % EX SOLN
Freq: Once | CUTANEOUS | Status: DC
  Filled 2016-04-30: qty 118

## 2016-04-30 MED ORDER — SUGAMMADEX SODIUM 200 MG/2ML IV SOLN
INTRAVENOUS | Status: DC | PRN
  Administered 2016-04-30: 200 mg via INTRAVENOUS

## 2016-04-30 MED ORDER — ZOLPIDEM TARTRATE 5 MG PO TABS: 5.0000 mg | ORAL_TABLET | Freq: Every evening | ORAL | Status: DC | PRN

## 2016-04-30 MED ORDER — IRBESARTAN 300 MG PO TABS
300.0000 mg | ORAL_TABLET | Freq: Every day | ORAL | Status: DC
  Administered 2016-04-30 – 2016-05-01 (×2): 300 mg via ORAL
  Filled 2016-04-30 (×2): qty 1

## 2016-04-30 MED ORDER — ACETAMINOPHEN 325 MG PO TABS: 650.0000 mg | ORAL_TABLET | Freq: Four times a day (QID) | ORAL | Status: DC | PRN

## 2016-04-30 MED ORDER — STERILE WATER FOR IRRIGATION IR SOLN
Status: DC | PRN
  Administered 2016-04-30: 1000 mL

## 2016-04-30 MED ORDER — FLEET ENEMA 7-19 GM/118ML RE ENEM: 1.0000 | ENEMA | Freq: Once | RECTAL | Status: DC | PRN

## 2016-04-30 MED ORDER — HYDROMORPHONE HCL 1 MG/ML IJ SOLN
0.2500 mg | INTRAMUSCULAR | Status: DC | PRN
  Administered 2016-04-30: 0.5 mg via INTRAVENOUS

## 2016-04-30 MED ORDER — OXYCODONE HCL 5 MG PO TABS
5.0000 mg | ORAL_TABLET | ORAL | Status: DC | PRN
  Administered 2016-04-30 (×3): 10 mg via ORAL
  Filled 2016-04-30: qty 2

## 2016-04-30 SURGICAL SUPPLY — 72 items
BIT DRILL 5/64X5 DISP (BIT) ×3 IMPLANT
BLADE SAW SAG 73X25 THK (BLADE) ×2
BLADE SAW SGTL 73X25 THK (BLADE) ×1 IMPLANT
BLADE SURG 15 STRL LF DISP TIS (BLADE) ×1 IMPLANT
BLADE SURG 15 STRL SS (BLADE) ×2
CAP SHOULDER TOTAL 2 ×3 IMPLANT
CEMENT BONE DEPUY (Cement) ×3 IMPLANT
CHLORAPREP W/TINT 26ML (MISCELLANEOUS) ×3 IMPLANT
CLOSURE STERI-STRIP 1/2X4 (GAUZE/BANDAGES/DRESSINGS) ×1
CLOSURE WOUND 1/2 X4 (GAUZE/BANDAGES/DRESSINGS) ×1
CLSR STERI-STRIP ANTIMIC 1/2X4 (GAUZE/BANDAGES/DRESSINGS) ×2 IMPLANT
COVER SURGICAL LIGHT HANDLE (MISCELLANEOUS) ×3 IMPLANT
DRAPE INCISE IOBAN 66X45 STRL (DRAPES) ×3 IMPLANT
DRAPE ORTHO SPLIT 77X108 STRL (DRAPES) ×4
DRAPE SURG 17X23 STRL (DRAPES) ×3 IMPLANT
DRAPE SURG ORHT 6 SPLT 77X108 (DRAPES) ×2 IMPLANT
DRAPE U-SHAPE 47X51 STRL (DRAPES) ×3 IMPLANT
DRSG AQUACEL AG ADV 3.5X10 (GAUZE/BANDAGES/DRESSINGS) ×3 IMPLANT
ELECT BLADE 4.0 EZ CLEAN MEGAD (MISCELLANEOUS)
ELECT REM PT RETURN 9FT ADLT (ELECTROSURGICAL) ×3
ELECTRODE BLDE 4.0 EZ CLN MEGD (MISCELLANEOUS) IMPLANT
ELECTRODE REM PT RTRN 9FT ADLT (ELECTROSURGICAL) ×1 IMPLANT
EVACUATOR 1/8 PVC DRAIN (DRAIN) IMPLANT
GLOVE BIO SURGEON STRL SZ7 (GLOVE) ×3 IMPLANT
GLOVE BIO SURGEON STRL SZ7.5 (GLOVE) ×3 IMPLANT
GLOVE BIOGEL PI IND STRL 7.0 (GLOVE) ×1 IMPLANT
GLOVE BIOGEL PI IND STRL 8 (GLOVE) ×1 IMPLANT
GLOVE BIOGEL PI INDICATOR 7.0 (GLOVE) ×2
GLOVE BIOGEL PI INDICATOR 8 (GLOVE) ×2
GOWN STRL REUS W/ TWL LRG LVL3 (GOWN DISPOSABLE) ×1 IMPLANT
GOWN STRL REUS W/ TWL XL LVL3 (GOWN DISPOSABLE) ×1 IMPLANT
GOWN STRL REUS W/TWL LRG LVL3 (GOWN DISPOSABLE) ×2
GOWN STRL REUS W/TWL XL LVL3 (GOWN DISPOSABLE) ×2
GUIDE WIRE ×3 IMPLANT
HANDPIECE INTERPULSE COAX TIP (DISPOSABLE) ×2
HEMOSTAT SURGICEL 2X14 (HEMOSTASIS) ×3 IMPLANT
HOOD PEEL AWAY FLYTE STAYCOOL (MISCELLANEOUS) ×6 IMPLANT
KIT BASIN OR (CUSTOM PROCEDURE TRAY) ×3 IMPLANT
KIT ROOM TURNOVER OR (KITS) ×3 IMPLANT
MANIFOLD NEPTUNE II (INSTRUMENTS) ×3 IMPLANT
NEEDLE HYPO 25GX1X1/2 BEV (NEEDLE) IMPLANT
NEEDLE MAYO TROCAR (NEEDLE) ×3 IMPLANT
NEEDLE SPNL 18GX3.5 QUINCKE PK (NEEDLE) ×6 IMPLANT
NS IRRIG 1000ML POUR BTL (IV SOLUTION) ×3 IMPLANT
PACK SHOULDER (CUSTOM PROCEDURE TRAY) ×3 IMPLANT
PAD ARMBOARD 7.5X6 YLW CONV (MISCELLANEOUS) ×6 IMPLANT
RESTRAINT HEAD UNIVERSAL NS (MISCELLANEOUS) ×3 IMPLANT
RETRIEVER SUT HEWSON (MISCELLANEOUS) ×3 IMPLANT
SET HNDPC FAN SPRY TIP SCT (DISPOSABLE) ×1 IMPLANT
SLING ARM IMMOBILIZER LRG (SOFTGOODS) ×3 IMPLANT
SLING ARM IMMOBILIZER MED (SOFTGOODS) IMPLANT
SMARTMIX MINI TOWER (MISCELLANEOUS) ×3
SPONGE LAP 18X18 X RAY DECT (DISPOSABLE) IMPLANT
SPONGE LAP 4X18 X RAY DECT (DISPOSABLE) IMPLANT
STRIP CLOSURE SKIN 1/2X4 (GAUZE/BANDAGES/DRESSINGS) ×2 IMPLANT
SUCTION FRAZIER HANDLE 10FR (MISCELLANEOUS) ×2
SUCTION TUBE FRAZIER 10FR DISP (MISCELLANEOUS) ×1 IMPLANT
SUPPORT WRAP ARM LG (MISCELLANEOUS) ×3 IMPLANT
SUT ETHIBOND NAB CT1 #1 30IN (SUTURE) ×9 IMPLANT
SUT MNCRL AB 4-0 PS2 18 (SUTURE) ×3 IMPLANT
SUT SILK 2 0 TIES 17X18 (SUTURE)
SUT SILK 2-0 18XBRD TIE BLK (SUTURE) IMPLANT
SUT VIC AB 2-0 CT1 27 (SUTURE) ×2
SUT VIC AB 2-0 CT1 TAPERPNT 27 (SUTURE) ×1 IMPLANT
SYR 50ML LL SCALE MARK (SYRINGE) ×6 IMPLANT
SYR CONTROL 10ML LL (SYRINGE) IMPLANT
TAPE LABRALWHITE 1.5X36 (TAPE) ×6 IMPLANT
TAPE SUT LABRALTAP WHT/BLK (SUTURE) ×3 IMPLANT
TOWEL OR 17X24 6PK STRL BLUE (TOWEL DISPOSABLE) ×3 IMPLANT
TOWEL OR 17X26 10 PK STRL BLUE (TOWEL DISPOSABLE) ×3 IMPLANT
TOWER SMARTMIX MINI (MISCELLANEOUS) ×1 IMPLANT
WATER STERILE IRR 1000ML POUR (IV SOLUTION) ×3 IMPLANT

## 2016-04-30 NOTE — Op Note (Signed)
Procedure(s): TOTAL SHOULDER ARTHROPLASTY Procedure Note  Darryl LenisRobert L Delsol male 56 y.o. 04/30/2016  Procedure(s) and Anesthesia Type:    * LEFT TOTAL SHOULDER ARTHROPLASTY - Choice  Surgeon(s) and Role:    * Jones BroomJustin Dusti Tetro, MD - Primary   Indications:  56 y.o. male  With endstage left shoulder arthritis. Pain and dysfunction interfered with quality of life and nonoperative treatment with activity modification, NSAIDS and injections failed.     Surgeon: Mable ParisHANDLER,Blaire Palomino WILLIAM   Assistants: Damita Lackanielle Lalibert PA-C [facility](Danielle was present and scrubbed throughout the procedure and was essential in positioning, retraction, exposure, and closure)  Anesthesia: General endotracheal anesthesia with preoperative interscalene block given by the attending anesthesiologist    Procedure Detail  TOTAL SHOULDER ARTHROPLASTY  Findings: Tornier flex anatomic press-fit size 4 stem with a 52 head, cemented size 40L Cortiloc glenoid.   A lesser tuberosity osteotomy was performed and repaired at the conclusion of the procedure.  Estimated Blood Loss:  200 mL         Drains: None   Blood Given: none          Specimens: none        Complications:  * No complications entered in OR log *         Disposition: PACU - hemodynamically stable.         Condition: stable    Procedure:   The patient was identified in the preoperative holding area where I personally marked the operative extremity after verifying with the patient and consent. He  was taken to the operating room where He was transferred to the   operative table.  The patient received an interscalene block in   the holding area by the attending anesthesiologist.  General anesthesia was induced   in the operating room without complication.  The patient did receive IV  Ancef prior to the commencement of the procedure.  The patient was   placed in the beach-chair position with the back raised about 30   degrees.  The nonoperative  extremity and head and neck were carefully   positioned and padded protecting against neurovascular compromise.  The   left upper extremity was then prepped and draped in the standard sterile   fashion.    The appropriate operative time-out was performed with   Anesthesia, the perioperative staff, as well as myself and we all agreed   that the left side was the correct operative site.The patient received 1 g IV tranexamic acid at the start of the case around time of the incision.   An approximately   10 cm incision was made from the tip of the coracoid to the center point of the   humerus at the level of the axilla.  Dissection was carried down sharply   through subcutaneous tissues and cephalic vein was identified and taken   laterally with the deltoid.  The pectoralis major was taken medially.  The   upper 1 cm of the pectoralis major was released from its attachment on   the humerus.  The clavipectoral fascia was incised just lateral to the   conjoined tendon.  This incision was carried up to but not into the   coracoacromial ligament.  Digital palpation was used to prove   integrity of the axillary nerve which was protected throughout the   procedure.  Musculocutaneous nerve was not palpated in the operative   field.  Conjoined tendon was then retracted gently medially and the   deltoid laterally.  Anterior  circumflex humeral vessels were clamped and   coagulated.  The soft tissues overlying the biceps was incised and this   incision was carried across the transverse humeral ligament to the base   of the coracoid.  The biceps was tenodesed to the soft tissue just above   pectoralis major and the remaining portion of the biceps superiorly was   excised.  An osteotomy was performed at the lesser tuberosity.  Capsule was then   released all the way down to the 6 o'clock position of the humeral head.   The humeral head was then delivered with simultaneous adduction,   extension and  external rotation.  All humeral osteophytes were removed   and the anatomic neck of the humerus was marked and cut free hand at   approximately 25 degrees retroversion within about 3 mm of the cuff   reflection posteriorly.  The head size was estimated to be a 52 medium   offset.  At that point, the humeral head was retracted posteriorly with   a Fukuda retractor.   Remaining portion of the capsule was released at the base of the   coracoid.  The remaining biceps anchor and the entire anterior-inferior   labrum was excised.  The posterior labrum was also excised but the   posterior capsule was not released.  The guidepin was placed bicortically with 5 degree elevated guide.  The reamer was used to ream to concentric bone with punctate bleeding.  This gave an excellent concentric surface.  The center hole was then drilled for an anchor peg glenoid followed by the three peripheral holes and none of the holes   exited the glenoid wall.  I then pulse irrigated these holes and dried   them with Surgicel.  The three peripheral holes were then   pressurized cemented and the anchor peg glenoid was placed and impacted   with an excellent fit.  The glenoid was a 40L component.  The proximal humerus was then again exposed taking care not to displace the glenoid.    The entry awl was used followed by sounding reamers and then sequentially broached from size 2 to 4. This was then left in place and the calcar planer was used. Trial head was placed with a 52.  With the trial implantation of the component,  there was approximately 50% posterior translation with immediate snap back to the   anatomic position.  With forward elevation, there was no tendency   towards posterior subluxation.   The trial was removed and the final implant was prepared on a back table.  The trial was removed and the final implant was prepared on a back table.   3 small holes were drilled on the medial side of the lesser tuberosity  osteotomy, through which 2 labral tapes were passed. The implant was then placed through the loop of the 2 labral tapes and impacted with an excellent press-fit. This achieved excellent anatomic reconstruction of the proximal humerus.  The joint was then copiously irrigated with pulse lavage.  The subscapularis and   lesser tuberosity osteotomy were then repaired using the 2 labral tapes previously passed in a double row fashion with horizontal mattress sutures medially brought over through bone tunnels tied over a bone bridge laterally.   One #1 Ethibond was placed at the rotator interval just above   the lesser tuberosity. Copious irrigation was used. Throughout the case a mixture of 20 mL liposomal bupivacaine and 40 mL normal saline was used  to infiltrate the deep capsular tissue, bony surfaces and subcutaneous tissue. Skin was closed with 2-0 Vicryl sutures in the deep dermal layer and 4-0 Monocryl in a subcuticular  running fashion.  Sterile dressings were then applied including Aquacel.  The patient was placed in a sling and allowed to awaken from general anesthesia and taken to the recovery room in stable condition.      POSTOPERATIVE PLAN:  Early passive range of motion will be allowed with the goal of 0 degrees external rotation and 90 degrees forward elevation.  No internal rotation at this time.  No active motion of the arm until the lesser tuberosity heals.  The patient will likely be kept in the hospital for 1-2 days and then discharged home.

## 2016-04-30 NOTE — Progress Notes (Signed)
Orthopedic Tech Progress Note Patient Details:  Arnoldo LenisRobert L Coreas 07-07-1960 [id number] Patient has arm sling Patient ID: Arnoldo LenisRobert L Tomasik, male   DOB: 07-07-1960, 56 y.o.   MRN: [id number]   Jennye MoccasinHughes, Roshini Fulwider Craig 04/30/2016, 5:21 PM

## 2016-04-30 NOTE — Discharge Instructions (Signed)
Discharge Instructions after Total Shoulder Arthroplasty   A sling has been provided for you. Remove the sling 5 times each day to perform motion exercises. Keep wearing this sling until your first visit with Dr. Ave Filterhandler. Use ice on the shoulder intermittently over the first 48 hours after surgery.  Pain medication has been prescribed for you.  Use your medication liberally over the first 48 hours, and then begin to taper your use. You may take Extra Strength Tylenol or Tylenol only in place of the pain pills. DO NOT take ANY nonsteroidal anti-inflammatory pain medications: Advil, Motrin, Ibuprofen, Aleve, Naproxen, or Naprosyn. Take one aspirin a day for 2 weeks after surgery, unless you have an aspirin sensitivity/allergy or asthma. Leave your dressing on until your first follow up visit.  You may shower with the dressing.  Hold your arm as if you still have your sling on while you shower. Active reaching and lifting are not permitted. You may use the operative arm for activities of daily living that do not require the operative arm to leave the side of the body, such as eating, drinking, bathing, etc.  Three to 5 times each day you should perform assisted overhead reaching and external rotation (outward turning) exercises with the operative arm. You were taught these exercises prior to discharge. Both exercises should be done with the non-operative arm used as the "therapist arm" while the operative arm remains relaxed. Ten of each exercise should be done three to five times each day.   Overhead reach is helping to lift your stiff arm up as high as it will go. To stretch your overhead reach, lie flat on your back, relax, and grasp the wrist of the tight shoulder with your opposite hand. Using the power in your opposite arm, bring the stiff arm up as far as it is comfortable. Start holding it for ten seconds and then work up to where you can hold it for a count of 30. Breathe slowly and deeply while  the arm is moved. Repeat this stretch ten times, trying to help the ar up a little higher each time.     External rotation is turning the arm out to the side while your elbow stays close to your body. External rotation is best stretched while you are lying on your back. Hold a cane, yardstick, broom handle, or dowel in both hands. Bend both elbows to a right angle. Use steady, gentle force from your normal arm to rotate the hand of the stiff shoulder out away from your body. Continue the rotation as far as it will go comfortably, holding it there for a count of 10. Repeat this exercise ten times.      Please call [phone] during normal business hours or [phone] after hours for any problems. Including the following:  - excessive redness of the incisions - drainage for more than 4 days - fever of more than 101.5 F  *Please note that pain medications will not be refilled after hours or on weekends.

## 2016-04-30 NOTE — H&P (Signed)
Darryl Payne is an 56 y.o. male.   Chief Complaint: L shoulder pain and dysfunction HPI: Endstage L shoulder arthritis with significant pain and dysfunction, failed conservative measures.  Pain interferes with sleep and quality of life.   Past Medical History:  Diagnosis Date  . Arthritis   . GERD (gastroesophageal reflux disease)   . Hypertension   . OA (osteoarthritis)    Left Shoulder  . Sleep apnea 04/2010   sleep study [facility], not using CPAP    Past Surgical History:  Procedure Laterality Date  . CARDIAC CATHETERIZATION     03/19/08 [facility]): Normal coronaries, EF 60%, no sign gradient across AV.   Marland Kitchen CARDIOVASCULAR STRESS TEST    . COLONOSCOPY  02/02/2010   normal/ repeat in 10 yrs- [place]  . FRACTURE SURGERY  56 yo   left ankle  . HERNIA REPAIR    . KNEE ARTHROSCOPY  09/17/2011   Procedure: ARTHROSCOPY KNEE;  Surgeon: Senaida Lange, MD;  Location: [facility] OR;  Service: Orthopedics;  Laterality: Left;  with Medial meniscectomy and debridement  . PROSTATE SURGERY     laser   . TRANSTHORACIC ECHOCARDIOGRAM     04/24/10 [facility]): NL LV systolic function, EF 65-70%, mild LVH, grade 1 diatolic dysfunction, mildly dilated RV/RA with normal RV contraction.     Family History  Problem Relation Age of Onset  . Diabetes Father   . Cancer Sister   . Cancer Brother   . Diabetes Brother    Social History:  reports that he has quit smoking. His smoking use included Cigarettes. He has never used smokeless tobacco. He reports that he does not drink alcohol or use drugs.  Allergies:  Allergies  Allergen Reactions  . Oxycodone Itching    Medications Prior to Admission  Medication Sig Dispense Refill  . Amlodipine-Valsartan-HCTZ 10-320-25 MG TABS Take 1 tablet by mouth daily. 90 tablet 1  . Ascorbic Acid (VITAMIN C PO) Take 1 tablet by mouth daily.    . Carboxymethylcellulose Sodium (LUBRICANT EYE DROPS OP) Place 1 drop into  both eyes daily as needed (for dry eyes).    . Cholecalciferol (VITAMIN D3) 5000 UNITS TABS Take 5,000 Units by mouth daily.     . fish oil-omega-3 fatty acids 1000 MG capsule Take 1 g by mouth daily.    . Flaxseed, Linseed, (FLAXSEED OIL PO) Take 1 capsule by mouth daily.    Marland Kitchen GINKGO BILOBA PO Take 1 capsule by mouth daily.    Marland Kitchen ibuprofen (ADVIL,MOTRIN) 800 MG tablet Take 800 mg by mouth every 8 (eight) hours as needed for pain.  3  . Multiple Vitamin (MULTIVITAMIN WITH MINERALS) TABS tablet Take 1 tablet by mouth daily.    . pantoprazole (PROTONIX) 40 MG tablet Take 1 tablet (40 mg total) by mouth daily. (Patient taking differently: Take 40 mg by mouth 2 (two) times daily. ) 30 tablet 6  . sildenafil (REVATIO) 20 MG tablet Take 60 mg by mouth daily as needed (for erectile dysfunction.).    Marland Kitchen meloxicam (MOBIC) 7.5 MG tablet Take 1 tablet (7.5 mg total) by mouth 2 (two) times daily. (Patient not taking: Reported on 04/20/2016) 60 tablet 4    No results found for this or any previous visit (from the past 48 hour(s)). No results found.  Review of Systems  All other systems reviewed and are negative.   Blood pressure 126/77, pulse 76, temperature 98.2 F (36.8 C), temperature source Oral, resp. rate  20, SpO2 99 %. Physical Exam  Constitutional: He is oriented to person, place, and time. He appears well-developed and well-nourished.  HENT:  Head: Atraumatic.  Eyes: EOM are normal.  Cardiovascular: Intact distal pulses.   Respiratory: Effort normal.  Musculoskeletal:  L shoulder pain with limited ROM  Neurological: He is alert and oriented to person, place, and time.  Skin: Skin is warm and dry.  Psychiatric: He has a normal mood and affect.     Assessment/Plan Endstage L shoulder arthritis with significant pain and dysfunction, failed conservative measures.  Pain interferes with sleep and quality of life. Plan L TSA Risks / benefits of surgery discussed Consent on chart  NPO for  OR Preop antibiotics    Mable ParisHANDLER,Darryl Bergum WILLIAM, MD 04/30/2016, 7:20 AM

## 2016-04-30 NOTE — Anesthesia Procedure Notes (Signed)
Procedure Name: Intubation Date/Time: 04/30/2016 7:35 AM Performed by: Shirlyn Goltz Pre-anesthesia Checklist: Patient identified, Emergency Drugs available, Suction available and Patient being monitored Patient Re-evaluated:Patient Re-evaluated prior to inductionOxygen Delivery Method: Circle system utilized Preoxygenation: Pre-oxygenation with 100% oxygen Intubation Type: IV induction Ventilation: Mask ventilation without difficulty Laryngoscope Size: Mac and 4 Grade View: Grade III Tube type: Oral Tube size: 7.5 mm Number of attempts: 1 Airway Equipment and Method: Stylet Placement Confirmation: ETT inserted through vocal cords under direct vision,  positive ETCO2 and breath sounds checked- equal and bilateral Secured at: 23 cm Tube secured with: Tape Dental Injury: Teeth and Oropharynx as per pre-operative assessment

## 2016-04-30 NOTE — Anesthesia Procedure Notes (Signed)
Anesthesia Regional Block: Interscalene brachial plexus block   Pre-Anesthetic Checklist: ,, timeout performed, Correct Patient, Correct Site, Correct Laterality, Correct Procedure,, site marked, risks and benefits discussed, Surgical consent,  Pre-op evaluation,  At surgeon's request and post-op pain management  Laterality: Left  Prep: chloraprep       Needles:  Injection technique: Single-shot  Needle Type: Echogenic Stimulator Needle     Needle Length: 5cm  Needle Gauge: 22     Additional Needles:   Procedures: ultrasound guided, nerve stimulator,,,,,,   Nerve Stimulator or Paresthesia:  Response: bicep contraction, 0.48 mA,   Additional Responses:   Narrative:  Start time: 04/30/2016 7:02 AM End time: 04/30/2016 7:12 AM Injection made incrementally with aspirations every 5 mL.  Performed by: Personally   Additional Notes: Functioning IV was confirmed and monitors applied.  A 50mm 22ga echogenic arrow stimulator was used. Sterile prep and drape,hand hygiene and sterile gloves were used.Ultrasound guidance: relevent anatomy identified, needle position confirmed, local anesthetic spread visualized around nerve(s)., vascular puncture avoided.  Image printed for medical record.  Negative aspiration and negative test dose prior to incremental administration of local anesthetic. The patient tolerated the procedure well.

## 2016-04-30 NOTE — Anesthesia Postprocedure Evaluation (Addendum)
Anesthesia Post Note  Patient: Darryl LenisRobert L Payne  Procedure(s) Performed: Procedure(s) (LRB): TOTAL SHOULDER ARTHROPLASTY (Left)  Patient location during evaluation: PACU Anesthesia Type: Regional Level of consciousness: sedated Pain management: pain level controlled Vital Signs Assessment: post-procedure vital signs reviewed and stable Respiratory status: spontaneous breathing and respiratory function stable Cardiovascular status: stable Anesthetic complications: no       Last Vitals:  Vitals:   04/30/16 1058 04/30/16 1113  BP: 120/80 125/80  Pulse: 83 77  Resp: 15 15  Temp:      Last Pain:  Vitals:   04/30/16 1115  TempSrc:   PainSc: Asleep                 Anysia Choi DANIEL

## 2016-04-30 NOTE — Transfer of Care (Signed)
Immediate Anesthesia Transfer of Care Note  Patient: Darryl LenisRobert L Odonnel  Procedure(s) Performed: Procedure(s) with comments: TOTAL SHOULDER ARTHROPLASTY (Left) - Left total shoulder arthroplasty  Patient Location: PACU  Anesthesia Type:General and Regional  Level of Consciousness: awake, alert , oriented and patient cooperative  Airway & Oxygen Therapy: Patient Spontanous Breathing and Patient connected to face mask oxygen  Post-op Assessment: Report given to RN and Post -op Vital signs reviewed and stable  Post vital signs: Reviewed and stable  Last Vitals:  Vitals:   04/30/16 0614 04/30/16 1016  BP: 126/77   Pulse: 76   Resp: 20   Temp: 36.8 C (P) 36.4 C    Last Pain:  Vitals:   04/30/16 1016  TempSrc:   PainSc: (P) 0-No pain      Patients Stated Pain Goal: 6 (04/30/16 0612)  Complications: No apparent anesthesia complications

## 2016-05-01 LAB — CBC
HCT: 38 % — ABNORMAL LOW (ref 39.0–52.0)
Hemoglobin: 12.8 g/dL — ABNORMAL LOW (ref 13.0–17.0)
MCH: 29 pg (ref 26.0–34.0)
MCHC: 33.7 g/dL (ref 30.0–36.0)
MCV: 86.2 fL (ref 78.0–100.0)
Platelets: 138 10*3/uL — ABNORMAL LOW (ref 150–400)
RBC: 4.41 MIL/uL (ref 4.22–5.81)
RDW: 14.8 % (ref 11.5–15.5)
WBC: 14.6 10*3/uL — ABNORMAL HIGH (ref 4.0–10.5)

## 2016-05-01 LAB — BASIC METABOLIC PANEL
Anion gap: 11 (ref 5–15)
BUN: 10 mg/dL (ref 6–20)
CO2: 23 mmol/L (ref 22–32)
Calcium: 8.2 mg/dL — ABNORMAL LOW (ref 8.9–10.3)
Chloride: 104 mmol/L (ref 101–111)
Creatinine, Ser: 1.07 mg/dL (ref 0.61–1.24)
GFR calc Af Amer: >60 mL/min (ref >60)
GFR calc non Af Amer: >60 mL/min (ref >60)
Glucose, Bld: 133 mg/dL — ABNORMAL HIGH (ref 65–99)
Potassium: 3.1 mmol/L — ABNORMAL LOW (ref 3.5–5.1)
Sodium: 138 mmol/L (ref 135–145)

## 2016-05-01 MED ORDER — POTASSIUM CHLORIDE 20 MEQ PO PACK
40.0000 meq | PACK | Freq: Once | ORAL | Status: DC
  Filled 2016-05-01: qty 2

## 2016-05-01 MED ORDER — POTASSIUM CHLORIDE CRYS ER 20 MEQ PO TBCR
40.0000 meq | EXTENDED_RELEASE_TABLET | Freq: Once | ORAL | Status: AC
  Administered 2016-05-01: 40 meq via ORAL
  Filled 2016-05-01: qty 2

## 2016-05-01 NOTE — Discharge Summary (Signed)
Patient ID: Darryl Payne MRN: [id number] DOB/AGE: 03/14/1960 56 y.o.  Admit date: 04/30/2016 Discharge date: 05/01/2016  Admission Diagnoses:  Active Problems:   S/P shoulder replacement, left   Discharge Diagnoses:  Same  Past Medical History:  Diagnosis Date  . Arthritis   . GERD (gastroesophageal reflux disease)   . Hypertension   . OA (osteoarthritis)    Left Shoulder  . Sleep apnea 04/2010   sleep study [facility], not using CPAP    Surgeries: Procedure(s): TOTAL SHOULDER ARTHROPLASTY on 04/30/2016   Consultants:   Discharged Condition: Improved  Hospital Course: Darryl Payne is an 56 y.o. male who was admitted 04/30/2016 for operative treatment of shoulder arthrititis. Patient has severe unremitting pain that affects sleep, daily activities, and work/hobbies. After pre-op clearance the patient was taken to the operating room on 04/30/2016 and underwent  Procedure(s): TOTAL SHOULDER ARTHROPLASTY.    Patient was given perioperative antibiotics: Anti-infectives    Start     Dose/Rate Route Frequency Ordered Stop   04/30/16 1745  ceFAZolin (ANCEF) IVPB 2g/100 mL premix     2 g 200 mL/hr over 30 Minutes Intravenous Every 6 hours 04/30/16 1643 05/01/16 0738   04/30/16 0453  ceFAZolin (ANCEF) IVPB 2g/100 mL premix     2 g 200 mL/hr over 30 Minutes Intravenous On call to O.R. 04/30/16 4098 04/30/16 0737       Patient was given sequential compression devices, early ambulation, and chemoprophylaxis to prevent DVT.  Patient benefited maximally from hospital stay and there were no complications.    Recent vital signs: Patient Vitals for the past 24 hrs:  BP Temp Temp src Pulse Resp SpO2  05/01/16 0448 112/67 98.5 F (36.9 C) Oral 85 16 95 %  04/30/16 2114 110/61 99 F (37.2 C) Oral 98 17 95 %  04/30/16 1604 105/67 98.2 F (36.8 C) Oral 87 15 97 %  04/30/16 1513 107/67 - - 82 14 98 %  04/30/16 1443 99/63 - - 87 15 97 %  04/30/16 1414 (!) 106/57 -  - 91 17 98 %  04/30/16 1344 106/69 - - 92 (!) 22 99 %  04/30/16 1313 120/84 - - 85 14 98 %  04/30/16 1243 116/79 - - 86 19 98 %  04/30/16 1213 120/86 - - 82 16 98 %  04/30/16 1143 120/86 - - 82 15 99 %  04/30/16 1128 123/81 - - 81 16 99 %  04/30/16 1113 125/80 - - 77 15 99 %  04/30/16 1058 120/80 - - 83 15 95 %  04/30/16 1043 115/77 - - 86 15 96 %  04/30/16 1028 108/76 - - 86 17 100 %  04/30/16 1016 - 97.6 F (36.4 C) - - - -  04/30/16 1013 111/71 - - 78 14 97 %     Recent laboratory studies:  Recent Labs  05/01/16 0349  WBC 14.6*  HGB 12.8*  HCT 38.0*  PLT 138*  NA 138  K 3.1*  CL 104  CO2 23  BUN 10  CREATININE 1.07  GLUCOSE 133*  CALCIUM 8.2*     Discharge Medications:   Allergies as of 05/01/2016      Reactions   Oxycodone Itching      Medication List    STOP taking these medications   ibuprofen 800 MG tablet Commonly known as:  ADVIL,MOTRIN   meloxicam 7.5 MG tablet Commonly known as:  MOBIC     TAKE these medications   Amlodipine-Valsartan-HCTZ 10-320-25  MG Tabs Take 1 tablet by mouth daily.   docusate sodium 100 MG capsule Commonly known as:  COLACE Take 1 capsule (100 mg total) by mouth 3 (three) times daily as needed.   fish oil-omega-3 fatty acids 1000 MG capsule Take 1 g by mouth daily.   FLAXSEED OIL PO Take 1 capsule by mouth daily.   GINKGO BILOBA PO Take 1 capsule by mouth daily.   LUBRICANT EYE DROPS OP Place 1 drop into both eyes daily as needed (for dry eyes).   multivitamin with minerals Tabs tablet Take 1 tablet by mouth daily.   oxyCODONE-acetaminophen 5-325 MG tablet Commonly known as:  ROXICET Take 1-2 tablets by mouth every 4 (four) hours as needed for severe pain.   pantoprazole 40 MG tablet Commonly known as:  PROTONIX Take 1 tablet (40 mg total) by mouth daily. What changed:  when to take this   sildenafil 20 MG tablet Commonly known as:  REVATIO Take 60 mg by mouth daily as needed (for erectile  dysfunction.).   VITAMIN C PO Take 1 tablet by mouth daily.   Vitamin D3 5000 units Tabs Take 5,000 Units by mouth daily.       Diagnostic Studies: Dg Chest 2 View  Result Date: 04/23/2016 CLINICAL DATA:  Left shoulder replacement. EXAM: CHEST  2 VIEW COMPARISON:  11/19/2011. FINDINGS: Mediastinum hilar structures normal. Cardiomegaly. Mild bilateral interstitial prominence. No pleural effusion or pneumothorax. Degenerative changes thoracic spine. IMPRESSION: Cardiomegaly. Mild bilateral interstitial prominence. Mild CHF cannot be excluded . Electronically Signed   By: Maisie Fus  Register   On: 04/23/2016 09:50   Dg Shoulder Left Port  Result Date: 04/30/2016 CLINICAL DATA:  Post left shoulder arthroplasty EXAM: LEFT SHOULDER - 1 VIEW COMPARISON:  None. FINDINGS: Changes of left shoulder replacement. No hardware complicating feature. No acute bony abnormality. IMPRESSION: Left shoulder replacement.  No visible complicating feature. Electronically Signed   By: Charlett Nose M.D.   On: 04/30/2016 11:24    Disposition: 01-Home or Self Care  Discharge Instructions    Call MD / Call 911    Complete by:  As directed    If you experience chest pain or shortness of breath, CALL 911 and be transported to the hospital emergency room.  If you develope a fever above 101 F, pus (white drainage) or increased drainage or redness at the wound, or calf pain, call your surgeon's office.   Call MD / Call 911    Complete by:  As directed    If you experience chest pain or shortness of breath, CALL 911 and be transported to the hospital emergency room.  If you develope a fever above 101 F, pus (white drainage) or increased drainage or redness at the wound, or calf pain, call your surgeon's office.   Constipation Prevention    Complete by:  As directed    Drink plenty of fluids.  Prune juice may be helpful.  You may use a stool softener, such as Colace (over the counter) 100 mg twice a day.  Use MiraLax (over  the counter) for constipation as needed.   Constipation Prevention    Complete by:  As directed    Drink plenty of fluids.  Prune juice may be helpful.  You may use a stool softener, such as Colace (over the counter) 100 mg twice a day.  Use MiraLax (over the counter) for constipation as needed.   Diet - low sodium heart healthy    Complete by:  As  directed    Diet - low sodium heart healthy    Complete by:  As directed    Increase activity slowly as tolerated    Complete by:  As directed    Increase activity slowly as tolerated    Complete by:  As directed       Follow-up Information    Mable Paris, MD. Schedule an appointment as soon as possible for a visit in 2 week(s).   Specialty:  Orthopedic Surgery Contact information: [address] [place] [address] [phone]            Signed: Mable Paris 05/01/2016, 8:17 AM

## 2016-05-01 NOTE — Progress Notes (Signed)
   PATIENT ID: Darryl Payne   1 Day Post-Op Procedure(s) (LRB): TOTAL SHOULDER ARTHROPLASTY (Left)  Subjective: Doing well, pain well controlled   Objective:  Vitals:   04/30/16 2114 05/01/16 0448  BP: 110/61 112/67  Pulse: 98 85  Resp: 17 16  Temp: 99 F (37.2 C) 98.5 F (36.9 C)    Dressing with sml bld.  NVID, sling intact  Labs:   Recent Labs  05/01/16 0349  HGB 12.8*   Recent Labs  05/01/16 0349  WBC 14.6*  RBC 4.41  HCT 38.0*  PLT 138*   Recent Labs  05/01/16 0349  NA 138  K 3.1*  CL 104  CO2 23  BUN 10  CREATININE 1.07  GLUCOSE 133*  CALCIUM 8.2*    Assessment and Plan: Doing well POD1 Hypokalemia: replete KCL OT today D/c home f/u 2 wks.  VTE proph: ECASA

## 2016-05-01 NOTE — Evaluation (Signed)
Occupational Therapy Evaluation Patient Details Name: Darryl Payne MRN: [id number] DOB: [birth year] Today's Date: 05/01/2016    History of Present Illness 56 yo male s/p L TSA   Past Medical History:  Diagnosis Date  . Arthritis   . GERD (gastroesophageal reflux disease)   . Hypertension   . OA (osteoarthritis)    Left Shoulder  . Sleep apnea 04/2010   sleep study [facility], not using CPAP      Clinical Impression   Patient evaluated by Occupational Therapy with no further acute OT needs identified. All education has been completed and the patient has no further questions. See below for any follow-up Occupational Therapy or equipment needs. OT to sign off. Thank you for referral.      Follow Up Recommendations  No OT follow up    Equipment Recommendations  None recommended by OT    Recommendations for Other Services       Precautions / Restrictions Precautions Precautions: Shoulder Type of Shoulder Precautions: passive Precaution Comments: FF 90 degrees NO abduction external rotation to neutral Required Braces or Orthoses: Sling      Mobility Bed Mobility               General bed mobility comments: oob on arrival ambulating  Transfers Overall transfer level: Modified independent                    Balance                                           ADL either performed or assessed with clinical judgement   ADL Overall ADL's : Needs assistance/impaired                           Toilet Transfer Details (indicate cue type and reason): exiting bathroom MOD i on arrival           General ADL Comments: see the shoulder session. pt positioned in chair for comfort  Pt educated on bathing and avoid washing directly on incision. Pt educated to use new wash cloth and towel each day. Pt educated to allow water to run across dressing and not to soak in a tub at this time. All education is complete and  patient indicates understanding.      Vision Baseline Vision/History: Wears glasses Wears Glasses: Reading only       Perception     Praxis      Pertinent Vitals/Pain Pain Assessment: 0-10 Pain Score: 3  Pain Location: shoulder Pain Descriptors / Indicators: Operative site guarding Pain Intervention(s): Monitored during session;Premedicated before session;Repositioned;Ice applied     Hand Dominance Right   Extremity/Trunk Assessment Upper Extremity Assessment Upper Extremity Assessment: RUE deficits/detail;LUE deficits/detail RUE Deficits / Details: AROM ~100 degrees but does report some changes in shoulder with limited shoulder flexion but within functional needs to don shirt and sling etc LUE Deficits / Details: s/p surg   Lower Extremity Assessment Lower Extremity Assessment: Overall WFL for tasks assessed   Cervical / Trunk Assessment Cervical / Trunk Assessment: Normal   Communication Communication Communication: No difficulties   Cognition Arousal/Alertness: Awake/alert Behavior During Therapy: WFL for tasks assessed/performed Overall Cognitive Status: Within Functional Limits for tasks assessed  General Comments  dressing dry and intact    Exercises Exercises: Shoulder Shoulder Exercises Shoulder Flexion: PROM;Left;10 reps;Supine (80 degrees) Shoulder ABduction:  (no aloowed) Shoulder External Rotation: PROM;Left;10 reps;Supine (neutral) Elbow Flexion: AROM;Left;Supine;10 reps Wrist Flexion: AROM;Left;10 reps;Supine Digit Composite Flexion: AROM;Left;Supine;10 reps Donning/doffing shirt without moving shoulder: Supervision/safety Method for sponge bathing under operated UE: Minimal assistance Donning/doffing sling/immobilizer: Supervision/safety Correct positioning of sling/immobilizer: Supervision/safety ROM for elbow, wrist and digits of operated UE: Independent Sling wearing schedule (on at all  times/off for ADL's): Independent Proper positioning of operated UE when showering: Independent Dressing change: Minimal assistance Positioning of UE while sleeping: Supervision/safety   Shoulder Instructions Shoulder Instructions Donning/doffing shirt without moving shoulder: Supervision/safety Method for sponge bathing under operated UE: Minimal assistance Donning/doffing sling/immobilizer: Supervision/safety Correct positioning of sling/immobilizer: Supervision/safety ROM for elbow, wrist and digits of operated UE: Independent Sling wearing schedule (on at all times/off for ADL's): Independent Proper positioning of operated UE when showering: Independent Dressing change: Minimal assistance Positioning of UE while sleeping: Supervision/safety    Home Living Family/patient expects to be discharged to:: Private residence Living Arrangements: Spouse/significant other Available Help at Discharge: Family Type of Home: House                 Bathroom Toilet: Standard         Additional Comments: will have wife (A) as needed      Prior Functioning/Environment Level of Independence: Independent                 OT Problem List:        OT Treatment/Interventions:      OT Goals(Current goals can be found in the care plan section)    OT Frequency:     Barriers to D/C:            Co-evaluation              End of Session Nurse Communication: Mobility status;Precautions  Activity Tolerance: Patient tolerated treatment well Patient left: in chair;with call bell/phone within reach  OT Visit Diagnosis: Unsteadiness on feet (R26.81)                Time: 3244-0102 OT Time Calculation (min): 16 min Charges:  OT General Charges $OT Visit: 1 Procedure OT Evaluation $OT Eval Moderate Complexity: 1 Procedure G-Codes:      Mateo Flow   OTR/L Pager: [phone] Office: [phone] .   Boone Master B 05/01/2016, 1:37 PM

## 2016-07-01 ENCOUNTER — Encounter: Payer: Self-pay | Admitting: Urology

## 2016-07-01 ENCOUNTER — Ambulatory Visit (INDEPENDENT_AMBULATORY_CARE_PROVIDER_SITE_OTHER): Payer: 59 | Admitting: Urology

## 2016-07-01 VITALS — BP 114/71 | HR 86 | Ht 69.0 in | Wt 253.2 lb

## 2016-07-01 DIAGNOSIS — N5314 Retrograde ejaculation: Secondary | ICD-10-CM | POA: Diagnosis not present

## 2016-07-01 DIAGNOSIS — N529 Male erectile dysfunction, unspecified: Secondary | ICD-10-CM | POA: Diagnosis not present

## 2016-07-01 DIAGNOSIS — Z125 Encounter for screening for malignant neoplasm of prostate: Secondary | ICD-10-CM

## 2016-07-01 DIAGNOSIS — N400 Enlarged prostate without lower urinary tract symptoms: Secondary | ICD-10-CM | POA: Diagnosis not present

## 2016-07-01 MED ORDER — SILDENAFIL CITRATE 20 MG PO TABS: ORAL_TABLET | ORAL | 3 refills | Status: DC

## 2016-07-01 NOTE — Progress Notes (Signed)
07/01/2016 3:25 PM   Darryl Payne 07-Dec-1960 [id number]  Referring provider: Clovis RileyMitchell, Elbert EwingsL.August Saucerean, MD [address] [place], [place] [address]  Chief Complaint  Patient presents with  . Benign Prostatic Hypertrophy    HPI: The patient is a 56 year old gentleman presents today for a prostate exam as well as problems with ejaculation.  1. Retrograde ejaculation The patient reports that he had a laser ablation of his prostate a few years ago. Since that time he does not ejaculate when he has orgasm. He is able to orgasm without difficulty. His only concern is that there is no ejaculate. He is not bothered by this but we wanted to make sure that it is not a problem.  2. Prostate cancer screening PSA was 2.1 in 1/18. No history of elevated PSAs.  3. BPH History of laser ablation of his prostate a few years ago. Currently asymptomatic. Has nocturia 1. Denies straining, hesitancy, intermittency, or feeling of incomplete bladder emptying.  4. Erectile dysfunction The patient is currently on it generic sildenafil. He takes 60 mg. This works well for him. He does not have any cardiac conditions or take nitrates for chest pain.    PMH: Past Medical History:  Diagnosis Date  . Arthritis   . GERD (gastroesophageal reflux disease)   . Hypertension   . OA (osteoarthritis)    Left Shoulder  . Sleep apnea 04/2010   sleep study [facility], not using CPAP    Surgical History: Past Surgical History:  Procedure Laterality Date  . CARDIAC CATHETERIZATION     03/19/08 [facility] Normal coronaries, EF 60%, no sign gradient across AV.   Marland Kitchen. CARDIOVASCULAR STRESS TEST    . COLONOSCOPY  02/02/2010   normal/ repeat in 10 yrs- [facility]  . FRACTURE SURGERY  56 yo   left ankle  . HERNIA REPAIR    . KNEE ARTHROSCOPY  09/17/2011   Procedure: ARTHROSCOPY KNEE;  Surgeon: Senaida LangeKevin M Supple, MD;  Location: [facility] OR;  Service: Orthopedics;  Laterality: Left;  with  Medial meniscectomy and debridement  . PROSTATE SURGERY     laser   . TOTAL SHOULDER ARTHROPLASTY Left 04/30/2016   Procedure: TOTAL SHOULDER ARTHROPLASTY;  Surgeon: Jones BroomJustin Chandler, MD;  Location: [facility] OR;  Service: Orthopedics;  Laterality: Left;  Left total shoulder arthroplasty  . TRANSTHORACIC ECHOCARDIOGRAM     04/24/10 [facility]): NL LV systolic function, EF 65-70%, mild LVH, grade 1 diatolic dysfunction, mildly dilated RV/RA with normal RV contraction.     Home Medications:  Allergies as of 07/01/2016      Reactions   Oxycodone Itching      Medication List       Accurate as of 07/01/16  3:25 PM. Always use your most recent med list.          Amlodipine-Valsartan-HCTZ 10-320-25 MG Tabs Take 1 tablet by mouth daily.   docusate sodium 100 MG capsule Commonly known as:  COLACE Take 1 capsule (100 mg total) by mouth 3 (three) times daily as needed.   fish oil-omega-3 fatty acids 1000 MG capsule Take 1 g by mouth daily.   FLAXSEED OIL PO Take 1 capsule by mouth daily.   GINKGO BILOBA PO Take 1 capsule by mouth daily.   LUBRICANT EYE DROPS OP Place 1 drop into both eyes daily as needed (for dry eyes).   multivitamin with minerals Tabs tablet Take 1 tablet by mouth daily.   oxyCODONE-acetaminophen 5-325 MG tablet Commonly known  as:  ROXICET Take 1-2 tablets by mouth every 4 (four) hours as needed for severe pain.   pantoprazole 40 MG tablet Commonly known as:  PROTONIX Take 1 tablet (40 mg total) by mouth daily.   sildenafil 20 MG tablet Commonly known as:  REVATIO Take 60 mg by mouth daily as needed (for erectile dysfunction.).   sildenafil 20 MG tablet Commonly known as:  REVATIO Take 1 to 5 tabs PO daily prn   VITAMIN C PO Take 1 tablet by mouth daily.   Vitamin D3 5000 units Tabs Take 5,000 Units by mouth daily.       Allergies:  Allergies  Allergen Reactions  . Oxycodone Itching    Family History: Family History  Problem  Relation Age of Onset  . Diabetes Father   . Cancer Sister   . Cancer Brother   . Diabetes Brother   . Prostate cancer Neg Hx   . Bladder Cancer Neg Hx   . Kidney cancer Neg Hx     Social History:  reports that he has quit smoking. His smoking use included Cigarettes. He has never used smokeless tobacco. He reports that he does not drink alcohol or use drugs.  ROS: UROLOGY Frequent Urination?: No Hard to postpone urination?: No Burning/pain with urination?: No Get up at night to urinate?: Yes Leakage of urine?: No Urine stream starts and stops?: No Trouble starting stream?: No Do you have to strain to urinate?: No Blood in urine?: No Urinary tract infection?: No Sexually transmitted disease?: No Injury to kidneys or bladder?: No Painful intercourse?: No Weak stream?: No Erection problems?: No Penile pain?: No  Gastrointestinal Nausea?: Yes Vomiting?: No Indigestion/heartburn?: Yes Diarrhea?: No Constipation?: No  Constitutional Fever: No Night sweats?: No Weight loss?: No Fatigue?: No  Skin Skin rash/lesions?: No Itching?: No  Eyes Blurred vision?: No Double vision?: No  Ears/Nose/Throat Sore throat?: No Sinus problems?: No  Hematologic/Lymphatic Swollen glands?: No Easy bruising?: No  Cardiovascular Leg swelling?: No Chest pain?: No  Respiratory Cough?: No Shortness of breath?: No  Endocrine Excessive thirst?: No  Musculoskeletal Back pain?: No Joint pain?: Yes  Neurological Headaches?: No Dizziness?: No  Psychologic Depression?: No Anxiety?: No  Physical Exam: BP 114/71 (BP Location: Left Arm, Patient Position: Sitting, Cuff Size: Large)   Pulse 86   Ht 5\' 9"  (1.753 m)   Wt 253 lb 3.2 oz (114.9 kg)   BMI 37.39 kg/m   Constitutional:  Alert and oriented, No acute distress. HEENT: [place] AT, moist mucus membranes.  Trachea midline, no masses. Cardiovascular: No clubbing, cyanosis, or edema. Respiratory: Normal respiratory  effort, no increased work of breathing. GI: Abdomen is soft, nontender, nondistended, no abdominal masses GU: No CVA tenderness. Normal phallus. Testicles descended bilaterally. No masses. DRE: 2+ smooth benign Skin: No rashes, bruises or suspicious lesions. Lymph: No cervical or inguinal adenopathy. Neurologic: Grossly intact, no focal deficits, moving all 4 extremities. Psychiatric: Normal mood and affect.  Laboratory Data: Lab Results  Component Value Date   WBC 14.6 (H) 05/01/2016   HGB 12.8 (L) 05/01/2016   HCT 38.0 (L) 05/01/2016   MCV 86.2 05/01/2016   PLT 138 (L) 05/01/2016    Lab Results  Component Value Date   CREATININE 1.07 05/01/2016    No results found for: PSA  No results found for: TESTOSTERONE  No results found for: HGBA1C  Urinalysis    Component Value Date/Time   COLORURINE YELLOW 04/23/2016 0905   APPEARANCEUR CLEAR 04/23/2016 0905  LABSPEC 1.019 04/23/2016 0905   PHURINE 5.0 04/23/2016 0905   GLUCOSEU NEGATIVE 04/23/2016 0905   HGBUR SMALL (A) 04/23/2016 0905   BILIRUBINUR NEGATIVE 04/23/2016 0905   KETONESUR NEGATIVE 04/23/2016 0905   PROTEINUR NEGATIVE 04/23/2016 0905   NITRITE NEGATIVE 04/23/2016 0905   LEUKOCYTESUR NEGATIVE 04/23/2016 0905    Assessment & Plan:    1. Retrograde ejaculation I informed the patient that this is a normal side effect of laser ablation of his prostate and 14 is not correctable. Fortunately is not bothered by this. I reassured him that there is nothing abnormal about this.  2. Prostate cancer screening Up to date. The patient will continue annual screening with his primary care provider.  3. BPH Currently asymptomatic. No treatment necessary  4. Erectile dysfunction The patient does report good success with generic sildenafil 60 mg. I've given him information of a pharmacy where he can find a medication for $1/pill as well as refilled his medication. Since he currently gets this medication through his  primary care provider, he will follow up further refills with him. If he would like to have his prostate examined by urology or obtain refills of his erectile dysfunction medication, he will follow-up with [place] annually. He'll otherwise follow up with [place] on an as needed basis and receive sildenafil and prostate cancer screening through his PCP.  Return if symptoms worsen or fail to improve.  Hildred Laser, MD  [facility] [address] [place], [place] [address] [phone]

## 2016-07-04 NOTE — Addendum Note (Signed)
Addendum  created 07/04/16 1112 by Henryk Ursin, MD   Sign clinical note    

## 2016-08-04 DIAGNOSIS — E669 Obesity, unspecified: Secondary | ICD-10-CM | POA: Insufficient documentation

## 2016-08-04 DIAGNOSIS — M1711 Unilateral primary osteoarthritis, right knee: Secondary | ICD-10-CM | POA: Insufficient documentation

## 2018-02-10 ENCOUNTER — Ambulatory Visit
Payer: 59 | Attending: Student in an Organized Health Care Education/Training Program | Admitting: Student in an Organized Health Care Education/Training Program

## 2018-02-10 ENCOUNTER — Encounter: Payer: Self-pay | Admitting: Student in an Organized Health Care Education/Training Program

## 2018-02-10 ENCOUNTER — Other Ambulatory Visit: Payer: Self-pay

## 2018-02-10 VITALS — BP 119/95 | HR 65 | Temp 97.8°F | Resp 16 | Ht 69.0 in | Wt 238.0 lb

## 2018-02-10 DIAGNOSIS — M1711 Unilateral primary osteoarthritis, right knee: Secondary | ICD-10-CM | POA: Diagnosis not present

## 2018-02-10 DIAGNOSIS — M1712 Unilateral primary osteoarthritis, left knee: Secondary | ICD-10-CM | POA: Diagnosis present

## 2018-02-10 DIAGNOSIS — G894 Chronic pain syndrome: Secondary | ICD-10-CM | POA: Insufficient documentation

## 2018-02-10 DIAGNOSIS — E669 Obesity, unspecified: Secondary | ICD-10-CM | POA: Insufficient documentation

## 2018-02-10 DIAGNOSIS — M170 Bilateral primary osteoarthritis of knee: Secondary | ICD-10-CM | POA: Insufficient documentation

## 2018-02-10 NOTE — Progress Notes (Addendum)
Patient's Name: Darryl Payne  MRN: [id number]  Referring Provider: Reche Dixon, PA-C  DOB: Jun 04, 1960  PCP: Aurea Graff.Marlou Sa, MD  DOS: 02/10/2018  Note by: Gillis Santa, MD  Service setting: Ambulatory outpatient  Specialty: Interventional Pain Management  Location: [facility] (AMB) Pain Management Facility  Visit type: Initial Patient Evaluation  Patient type: New Patient   Primary Reason(s) for Visit: Encounter for initial evaluation of one or more chronic problems (new to examiner) potentially causing chronic pain, and posing a threat to normal musculoskeletal function. (Level of risk: High) CC: Knee Pain (bilaterally)  HPI  Darryl Payne is a 58 y.o. year old, male patient, who comes today to see [place] for the first time for an initial evaluation of his chronic pain. He has S/P shoulder replacement, left; Glenohumeral arthritis, right; Obesity (BMI 35.0-39.9 without comorbidity); Primary osteoarthritis of right knee; Bilateral primary osteoarthritis of knee; Primary osteoarthritis of left knee; and Chronic pain syndrome on their problem list. Today he comes in for evaluation of his Knee Pain (bilaterally)  Pain Assessment: Location: Right, Left(left is worse) Knee Radiating: sometimes down to shins bilaterally Onset: More than a month ago Duration: Chronic pain Quality: Aching, Sharp Severity: 4 /10 (subjective, self-reported pain score)  Note: Reported level is compatible with observation.                         When using our objective Pain Scale, levels between 6 and 10/10 are said to belong in an emergency room, as it progressively worsens from a 6/10, described as severely limiting, requiring emergency care not usually available at an outpatient pain management facility. At a 6/10 level, communication becomes difficult and requires great effort. Assistance to reach the emergency department may be required. Facial flushing and profuse sweating along with potentially dangerous increases in heart  rate and blood pressure will be evident. Effect on ADL: limits daily activity Timing: Constant Modifying factors: cortisone shots, ibuprofen BP: (!) 119/95  HR: 65  Onset and Duration: Present longer than 3 months Cause of pain: Unknown Severity: Getting better, NAS-11 at its worse: 7/10, NAS-11 at its best: 2/10, NAS-11 now: 4/10 and NAS-11 on the average: 3/10 Timing: Not influenced by the time of the day, After activity or exercise and After a period of immobility Aggravating Factors: Kneeling, Lifiting, Prolonged standing, Squatting and Stooping  Alleviating Factors: Walking Associated Problems: Night-time cramps Quality of Pain: Annoying and Throbbing Previous Examinations or Tests: CT scan, MRI scan and X-rays Previous Treatments: Stretching exercises  The patient comes into the clinics today for the first time for a chronic pain management evaluation.   58 year old male with a history of obesity, bilateral knee osteoarthritis who was referred from [address] for evaluation of genicular nerve block and possible radiofrequency ablation.  Patient has received bilateral knee cortisone injections in the past which provided him with moderate benefit.  He is also tried Visco supplementation which she found less effective than intra-articular steroids.  Patient is not a diabetic.  He is not on any blood thinners.  In regards to movements, patient has difficulty walking secondary to stiffness along the medial component of his knee and also has difficulty squatting.  He does endorse swelling at times especially when he is on his feet.  He takes ibuprofen pRN  Of note patient does have a history of left knee arthroscopy in 2013 along with left shoulder replacement in 2018.   Meds   Current Outpatient Medications:  .  Amlodipine-Valsartan-HCTZ 10-320-25 MG TABS, Take 1 tablet by mouth daily., Disp: 90 tablet, Rfl: 1 .  Ascorbic Acid (VITAMIN C PO), Take 1 tablet by mouth daily.,  Disp: , Rfl:  .  Cholecalciferol (VITAMIN D3) 5000 UNITS TABS, Take 5,000 Units by mouth daily. , Disp: , Rfl:  .  fish oil-omega-3 fatty acids 1000 MG capsule, Take 1 g by mouth daily., Disp: , Rfl:  .  Flaxseed, Linseed, (FLAXSEED OIL PO), Take 1 capsule by mouth daily., Disp: , Rfl:  .  GINKGO BILOBA PO, Take 1 capsule by mouth daily., Disp: , Rfl:  .  ibuprofen (ADVIL,MOTRIN) 800 MG tablet, Take 800 mg by mouth daily., Disp: , Rfl:  .  Multiple Vitamin (MULTIVITAMIN WITH MINERALS) TABS tablet, Take 1 tablet by mouth daily., Disp: , Rfl:  .  pantoprazole (PROTONIX) 40 MG tablet, Take 1 tablet (40 mg total) by mouth daily. (Patient taking differently: Take 40 mg by mouth 2 (two) times daily. ), Disp: 30 tablet, Rfl: 6 .  sildenafil (REVATIO) 20 MG tablet, Take 60 mg by mouth daily as needed (for erectile dysfunction.)., Disp: , Rfl:  .  sildenafil (REVATIO) 20 MG tablet, Take 1 to 5 tabs PO daily prn, Disp: 90 tablet, Rfl: 3 .  Carboxymethylcellulose Sodium (LUBRICANT EYE DROPS OP), Place 1 drop into both eyes daily as needed (for dry eyes)., Disp: , Rfl:  .  docusate sodium (COLACE) 100 MG capsule, Take 1 capsule (100 mg total) by mouth 3 (three) times daily as needed. (Patient not taking: Reported on 07/01/2016), Disp: 20 capsule, Rfl: 0 .  oxyCODONE-acetaminophen (ROXICET) 5-325 MG tablet, Take 1-2 tablets by mouth every 4 (four) hours as needed for severe pain. (Patient not taking: Reported on 07/01/2016), Disp: 30 tablet, Rfl: 0  Imaging Review   Xrays of the left knee were ordered and interpreted 03/09/2014, with 3 views  usingAP , lateral, patella views.Xrays revealed medial compartment  collapse with bone on bone pathology.There is right knee medial  compartment narrowing but no bone on bone pathology.The patellofemoral  joint appears to be intact. ROS  Cardiovascular: High blood pressure Pulmonary or Respiratory: No reported pulmonary signs or symptoms such as wheezing and  difficulty taking a deep full breath (Asthma), difficulty blowing air out (Emphysema), coughing up mucus (Bronchitis), persistent dry cough, or temporary stoppage of breathing during sleep Neurological: No reported neurological signs or symptoms such as seizures, abnormal skin sensations, urinary and/or fecal incontinence, being born with an abnormal open spine and/or a tethered spinal cord Review of Past Neurological Studies: No results found for this or any previous visit. Psychological-Psychiatric: No reported psychological or psychiatric signs or symptoms such as difficulty sleeping, anxiety, depression, delusions or hallucinations (schizophrenial), mood swings (bipolar disorders) or suicidal ideations or attempts Gastrointestinal: Reflux or heatburn Genitourinary: No reported renal or genitourinary signs or symptoms such as difficulty voiding or producing urine, peeing blood, non-functioning kidney, kidney stones, difficulty emptying the bladder, difficulty controlling the flow of urine, or chronic kidney disease Hematological: No reported hematological signs or symptoms such as prolonged bleeding, low or poor functioning platelets, bruising or bleeding easily, hereditary bleeding problems, low energy levels due to low hemoglobin or being anemic Endocrine: No reported endocrine signs or symptoms such as high or low blood sugar, rapid heart rate due to high thyroid levels, obesity or weight gain due to slow thyroid or thyroid disease Rheumatologic: No reported rheumatological signs and symptoms such as fatigue, joint pain, tenderness, swelling, redness, heat, stiffness, decreased range  of motion, with or without associated rash Musculoskeletal: Negative for myasthenia gravis, muscular dystrophy, multiple sclerosis or malignant hyperthermia Work History: Working full time  Allergies  Mr. Wong is allergic to oxycodone.  Laboratory Chemistry  Inflammation Markers (CRP: Acute Phase) (ESR: Chronic  Phase) No results found for: CRP, ESRSEDRATE, LATICACIDVEN                       Rheumatology Markers No results found for: RF, ANA, LABURIC, URICUR, LYMEIGGIGMAB, LYMEABIGMQN, HLAB27                      Renal Function Markers Lab Results  Component Value Date   BUN 10 05/01/2016   CREATININE 1.07 05/01/2016   GFRAA >60 05/01/2016   GFRNONAA >60 05/01/2016                             Hepatic Function Markers Lab Results  Component Value Date   AST 22 04/23/2016   ALT 18 04/23/2016   ALBUMIN 4.0 04/23/2016   ALKPHOS 63 04/23/2016                        Electrolytes Lab Results  Component Value Date   NA 138 05/01/2016   K 3.1 (L) 05/01/2016   CL 104 05/01/2016   CALCIUM 8.2 (L) 05/01/2016                        Neuropathy Markers No results found for: VITAMINB12, FOLATE, HGBA1C, HIV                      CNS Tests No results found for: COLORCSF, APPEARCSF, RBCCOUNTCSF, WBCCSF, POLYSCSF, LYMPHSCSF, EOSCSF, PROTEINCSF, GLUCCSF, JCVIRUS, CSFOLI, IGGCSF                      Bone Pathology Markers No results found for: VD25OH, [id number], [id number], FS1423TR3, 25OHVITD1, 25OHVITD2, 25OHVITD3, TESTOFREE, TESTOSTERONE                       Coagulation Parameters Lab Results  Component Value Date   INR 1.01 04/23/2016   LABPROT 13.3 04/23/2016   APTT 27 04/23/2016   PLT 138 (L) 05/01/2016                        Cardiovascular Markers Lab Results  Component Value Date   HGB 12.8 (L) 05/01/2016   HCT 38.0 (L) 05/01/2016                         CA Markers No results found for: CEA, CA125, LABCA2                      Note: Lab results reviewed.  [place]  Drug: Mr. Peyton  reports no history of drug use. Alcohol:  reports no history of alcohol use. Tobacco:  reports that he has quit smoking. His smoking use included cigarettes. He has never used smokeless tobacco. Medical:  has a past medical history of Arthritis, GERD (gastroesophageal reflux disease),  Hypertension, OA (osteoarthritis), and Sleep apnea (04/2010). Family: family history includes Cancer in his brother and sister; Diabetes in his brother and father.  Past Surgical History:  Procedure Laterality Date  . CARDIAC CATHETERIZATION  03/19/08 [facility]): Normal coronaries, EF 60%, no sign gradient across AV.   Marland Kitchen CARDIOVASCULAR STRESS TEST    . COLONOSCOPY  02/02/2010   normal/ repeat in 10 yrs- [facility]  . FRACTURE SURGERY  58 yo   left ankle  . HERNIA REPAIR    . KNEE ARTHROSCOPY  09/17/2011   Procedure: ARTHROSCOPY KNEE;  Surgeon: Marin Shutter, MD;  Location: [place];  Service: Orthopedics;  Laterality: Left;  with Medial meniscectomy and debridement  . PROSTATE SURGERY     laser   . TOTAL SHOULDER ARTHROPLASTY Left 04/30/2016   Procedure: TOTAL SHOULDER ARTHROPLASTY;  Surgeon: Tania Ade, MD;  Location: [place];  Service: Orthopedics;  Laterality: Left;  Left total shoulder arthroplasty  . TRANSTHORACIC ECHOCARDIOGRAM     04/24/10 [facility]): NL LV systolic function, EF 58-30%, mild LVH, grade 1 diatolic dysfunction, mildly dilated RV/RA with normal RV contraction.    Active Ambulatory Problems    Diagnosis Date Noted  . S/P shoulder replacement, left 04/30/2016  . Glenohumeral arthritis, right 06/27/2014  . Obesity (BMI 35.0-39.9 without comorbidity) 08/04/2016  . Primary osteoarthritis of right knee 08/04/2016  . Bilateral primary osteoarthritis of knee 02/10/2018  . Primary osteoarthritis of left knee 02/10/2018  . Chronic pain syndrome 02/10/2018   Resolved Ambulatory Problems    Diagnosis Date Noted  . No Resolved Ambulatory Problems   Past Medical History:  Diagnosis Date  . Arthritis   . GERD (gastroesophageal reflux disease)   . Hypertension   . OA (osteoarthritis)   . Sleep apnea 04/2010   Constitutional Exam  General appearance: Well nourished, well developed, and well hydrated. In no apparent acute distress Vitals:     02/10/18 0824  BP: (!) 119/95  Pulse: 65  Resp: 16  Temp: 97.8 F (36.6 C)  TempSrc: Oral  SpO2: 99%  Weight: 238 lb (108 kg)  Height: 5' 9"  (1.753 m)   BMI Assessment: Estimated body mass index is 35.15 kg/m as calculated from the following:   Height as of this encounter: 5' 9"  (1.753 m).   Weight as of this encounter: 238 lb (108 kg).  BMI interpretation table: BMI level Category Range association with higher incidence of chronic pain  <18 kg/m2 Underweight   18.5-24.9 kg/m2 Ideal body weight   25-29.9 kg/m2 Overweight Increased incidence by 20%  30-34.9 kg/m2 Obese (Class I) Increased incidence by 68%  35-39.9 kg/m2 Severe obesity (Class II) Increased incidence by 136%  >40 kg/m2 Extreme obesity (Class III) Increased incidence by 254%   Patient's current BMI Ideal Body weight  Body mass index is 35.15 kg/m. Ideal body weight: 70.7 kg (155 lb 13.8 oz) Adjusted ideal body weight: 85.6 kg (188 lb 11.5 oz)   BMI Readings from Last 4 Encounters:  02/10/18 35.15 kg/m  07/01/16 37.39 kg/m  04/23/16 35.83 kg/m  06/18/15 36.77 kg/m   Wt Readings from Last 4 Encounters:  02/10/18 238 lb (108 kg)  07/01/16 253 lb 3.2 oz (114.9 kg)  04/23/16 242 lb 9.6 oz (110 kg)  06/18/15 249 lb (112.9 kg)  Psych/Mental status: Alert, oriented x 3 (person, place, & time)       Eyes: PERLA Respiratory: No evidence of acute respiratory distress  Cervical Spine Area Exam  Skin & Axial Inspection: No masses, redness, edema, swelling, or associated skin lesions Alignment: Symmetrical Functional ROM: Unrestricted ROM      Stability: No instability detected Muscle Tone/Strength: Functionally intact. No obvious neuro-muscular anomalies detected. Sensory (  Neurological): Unimpaired Palpation: No palpable anomalies              Upper Extremity (UE) Exam    Side: Right upper extremity  Side: Left upper extremity  Skin & Extremity Inspection: Skin color, temperature, and hair growth are  WNL. No peripheral edema or cyanosis. No masses, redness, swelling, asymmetry, or associated skin lesions. No contractures.  Skin & Extremity Inspection: Skin color, temperature, and hair growth are WNL. No peripheral edema or cyanosis. No masses, redness, swelling, asymmetry, or associated skin lesions. No contractures.  Functional ROM: Unrestricted ROM          Functional ROM: Unrestricted ROM          Muscle Tone/Strength: Functionally intact. No obvious neuro-muscular anomalies detected.  Muscle Tone/Strength: Functionally intact. No obvious neuro-muscular anomalies detected.  Sensory (Neurological): Unimpaired          Sensory (Neurological): Unimpaired          Palpation: No palpable anomalies              Palpation: No palpable anomalies              Provocative Test(s):  Phalen's test: deferred Tinel's test: deferred Apley's scratch test (touch opposite shoulder):  Action 1 (Across chest): deferred Action 2 (Overhead): deferred Action 3 (LB reach): deferred   Provocative Test(s):  Phalen's test: deferred Tinel's test: deferred Apley's scratch test (touch opposite shoulder):  Action 1 (Across chest): deferred Action 2 (Overhead): deferred Action 3 (LB reach): deferred    Thoracic Spine Area Exam  Skin & Axial Inspection: No masses, redness, or swelling Alignment: Symmetrical Functional ROM: Unrestricted ROM Stability: No instability detected Muscle Tone/Strength: Functionally intact. No obvious neuro-muscular anomalies detected. Sensory (Neurological): Unimpaired Muscle strength & Tone: No palpable anomalies  Lumbar Spine Area Exam  Skin & Axial Inspection: No masses, redness, or swelling Alignment: Symmetrical Functional ROM: Unrestricted ROM       Stability: No instability detected Muscle Tone/Strength: Functionally intact. No obvious neuro-muscular anomalies detected. Sensory (Neurological): Unimpaired Palpation: No palpable anomalies       Provocative  Tests: Hyperextension/rotation test: deferred today       Lumbar quadrant test (Kemp's test): deferred today       Lateral bending test: deferred today       Patrick's Maneuver: deferred today                   FABER* test: deferred today                   S-I anterior distraction/compression test: deferred today         S-I lateral compression test: deferred today         S-I Thigh-thrust test: deferred today         S-I Gaenslen's test: deferred today         *(Flexion, ABduction and External Rotation)  Gait & Posture Assessment  Ambulation: Unassisted Gait: Relatively normal for age and body habitus Posture: WNL   Lower Extremity Exam    Side: Right lower extremity  Side: Left lower extremity  Stability: No instability observed          Stability: No instability observed          Skin & Extremity Inspection: Skin color, temperature, and hair growth are WNL. No peripheral edema or cyanosis. No masses, redness, swelling, asymmetry, or associated skin lesions. No contractures.  Skin & Extremity Inspection: Evidence of prior arthroplastic surgery  Functional ROM: Pain restricted ROM for knee joint          Functional ROM: Pain restricted ROM for knee joint          Muscle Tone/Strength: Functionally intact. No obvious neuro-muscular anomalies detected.  Muscle Tone/Strength: Functionally intact. No obvious neuro-muscular anomalies detected.  Sensory (Neurological): Arthropathic arthralgia        Sensory (Neurological): Arthropathic arthralgia        DTR: Patellar: 1+: trace Achilles: deferred today Plantar: deferred today  DTR: Patellar: 0: absent Achilles: deferred today Plantar: deferred today  Palpation: No palpable anomalies  Palpation: No palpable anomalies   Assessment  Primary Diagnosis & Pertinent Problem List: The primary encounter diagnosis was Bilateral primary osteoarthritis of knee. Diagnoses of Primary osteoarthritis of right knee, Primary osteoarthritis of left  knee, Obesity (BMI 35.0-39.9 without comorbidity), and Chronic pain syndrome were also pertinent to this visit.  Visit Diagnosis (New problems to examiner): 1. Bilateral primary osteoarthritis of knee   2. Primary osteoarthritis of right knee   3. Primary osteoarthritis of left knee   4. Obesity (BMI 35.0-39.9 without comorbidity)   5. Chronic pain syndrome    Risks and benefits of bilateral genicular neve block discussed. If positive diagnostic blocks will proceed with bilateral genicular nerve radiofrequency ablation   Plan of Care (Initial workup plan)   Ordered Lab-work, Procedure(s), Referral(s), & Consult(s): Orders Placed This Encounter  Procedures  . GENICULAR NERVE BLOCK    Provider-requested follow-up: Return for Procedure.  No future appointments.  Primary Care Physician: Alroy Dust, L.Marlou Sa, MD Location: [facility] Outpatient [facility] Management Facility Note by: Gillis Santa, M.D, Date: 02/10/2018; Time: 11:54 AM  Patient Instructions   Moderate Conscious Sedation, Adult Sedation is the use of medicines to promote relaxation and relieve discomfort and anxiety. Moderate conscious sedation is a type of sedation. Under moderate conscious sedation, you are less alert than normal, but you are still able to respond to instructions, touch, or both. Moderate conscious sedation is used during short medical and dental procedures. It is milder than deep sedation, which is a type of sedation under which you cannot be easily woken up. It is also milder than general anesthesia, which is the use of medicines to make you unconscious. Moderate conscious sedation allows you to return to your regular activities sooner. Tell a health care provider about:  Any allergies you have.  All medicines you are taking, including vitamins, herbs, eye drops, creams, and over-the-counter medicines.  Use of steroids (by mouth or creams).  Any problems you or family members have had with sedatives and anesthetic  medicines.  Any blood disorders you have.  Any surgeries you have had.  Any medical conditions you have, such as sleep apnea.  Whether you are pregnant or may be pregnant.  Any use of cigarettes, alcohol, marijuana, or street drugs. What are the risks? Generally, this is a safe procedure. However, problems may occur, including:  Getting too much medicine (oversedation).  Nausea.  Allergic reaction to medicines.  Trouble breathing. If this happens, a breathing tube may be used to help with breathing. It will be removed when you are awake and breathing on your own.  Heart trouble.  Lung trouble. What happens before the procedure? Staying hydrated Follow instructions from your health care provider about hydration, which may include:  Up to 2 hours before the procedure - you may continue to drink clear liquids, such as water, clear fruit juice, black coffee, and plain tea. Eating and drinking restrictions  Follow instructions from your health care provider about eating and drinking, which may include:  8 hours before the procedure - stop eating heavy meals or foods such as meat, fried foods, or fatty foods.  6 hours before the procedure - stop eating light meals or foods, such as toast or cereal.  6 hours before the procedure - stop drinking milk or drinks that contain milk.  2 hours before the procedure - stop drinking clear liquids. Medicine Ask your health care provider about:  Changing or stopping your regular medicines. This is especially important if you are taking diabetes medicines or blood thinners.  Taking medicines such as aspirin and ibuprofen. These medicines can thin your blood. Do not take these medicines before your procedure if your health care provider instructs you not to.  Tests and exams  You will have a physical exam.  You may have blood tests done to show: ? How well your kidneys and liver are working. ? How well your blood can clot. General  instructions  Plan to have someone take you home from the hospital or clinic.  If you will be going home right after the procedure, plan to have someone with you for 24 hours. What happens during the procedure?  An IV tube will be inserted into one of your veins.  Medicine to help you relax (sedative) will be given through the IV tube.  The medical or dental procedure will be performed. What happens after the procedure?  Your blood pressure, heart rate, breathing rate, and blood oxygen level will be monitored often until the medicines you were given have worn off.  Do not drive for 24 hours. This information is not intended to replace advice given to you by your health care provider. Make sure you discuss any questions you have with your health care provider. Document Released: 10/14/2000 Document Revised: 06/25/2015 Document Reviewed: 05/11/2015 Elsevier Interactive Patient Education  2019 [place]  What are the risk, side effects and possible complications? Generally speaking, most procedures are safe.  However, with any procedure there are risks, side effects, and the possibility of complications.  The risks and complications are dependent upon the sites that are lesioned, or the type of nerve block to be performed.  The closer the procedure is to the spine, the more serious the risks are.  Great care is taken when placing the radio frequency needles, block needles or lesioning probes, but sometimes complications can occur. 1. Infection: Any time there is an injection through the skin, there is a risk of infection.  This is why sterile conditions are used for these blocks.  There are four possible types of infection. 1. Localized skin infection. 2. Central Nervous System Infection-This can be in the form of Meningitis, which can be deadly. 3. Epidural Infections-This can be in the form of an epidural abscess, which can cause pressure inside of  the spine, causing compression of the spinal cord with subsequent paralysis. This would require an emergency surgery to decompress, and there are no guarantees that the patient would recover from the paralysis. 4. Discitis-This is an infection of the intervertebral discs.  It occurs in about 1% of discography procedures.  It is difficult to treat and it may lead to surgery.        2. Pain: the needles have to go through skin and soft tissues, will cause soreness.       3. Damage to internal structures:  The nerves  to be lesioned may be near blood vessels or    other nerves which can be potentially damaged.       4. Bleeding: Bleeding is more common if the patient is taking blood thinners such as  aspirin, Coumadin, Ticiid, Plavix, etc., or if he/she have some genetic predisposition  such as hemophilia. Bleeding into the spinal canal can cause compression of the spinal  cord with subsequent paralysis.  This would require an emergency surgery to  decompress and there are no guarantees that the patient would recover from the  paralysis.       5. Pneumothorax:  Puncturing of a lung is a possibility, every time a needle is introduced in  the area of the chest or upper back.  Pneumothorax refers to free air around the  collapsed lung(s), inside of the thoracic cavity (chest cavity).  Another two possible  complications related to a similar event would include: Hemothorax and Chylothorax.   These are variations of the Pneumothorax, where instead of air around the collapsed  lung(s), you may have blood or chyle, respectively.       6. Spinal headaches: They may occur with any procedures in the area of the spine.       7. Persistent CSF (Cerebro-Spinal Fluid) leakage: This is a rare problem, but may occur  with prolonged intrathecal or epidural catheters either due to the formation of a fistulous  track or a dural tear.       8. Nerve damage: By working so close to the spinal cord, there is always a possibility of    nerve damage, which could be as serious as a permanent spinal cord injury with  paralysis.       9. Death:  Although rare, severe deadly allergic reactions known as "Anaphylactic  reaction" can occur to any of the medications used.      10. Worsening of the symptoms:  We can always make thing worse.  What are the chances of something like this happening? Chances of any of this occuring are extremely low.  By statistics, you have more of a chance of getting killed in a motor vehicle accident: while driving to the hospital than any of the above occurring .  Nevertheless, you should be aware that they are possibilities.  In general, it is similar to taking a shower.  Everybody knows that you can slip, hit your head and get killed.  Does that mean that you should not shower again?  Nevertheless always keep in mind that statistics do not mean anything if you happen to be on the wrong side of them.  Even if a procedure has a 1 (one) in a 1,000,000 (million) chance of going wrong, it you happen to be that one..Also, keep in mind that by statistics, you have more of a chance of having something go wrong when taking medications.  Who should not have this procedure? If you are on a blood thinning medication (e.g. Coumadin, Plavix, see list of "Blood Thinners"), or if you have an active infection going on, you should not have the procedure.  If you are taking any blood thinners, please inform your physician.  How should I prepare for this procedure?  Do not eat or drink anything at least six hours prior to the procedure.  Bring a driver with you .  It cannot be a taxi.  Come accompanied by an adult that can drive you back, and that is strong enough to help you if  your legs get weak or numb from the local anesthetic.  Take all of your medicines the morning of the procedure with just enough water to swallow them.  If you have diabetes, make sure that you are scheduled to have your procedure done first thing  in the morning, whenever possible.  If you have diabetes, take only half of your insulin dose and notify our nurse that you have done so as soon as you arrive at the clinic.  If you are diabetic, but only take blood sugar pills (oral hypoglycemic), then do not take them on the morning of your procedure.  You may take them after you have had the procedure.  Do not take aspirin or any aspirin-containing medications, at least eleven (11) days prior to the procedure.  They may prolong bleeding.  Wear loose fitting clothing that may be easy to take off and that you would not mind if it got stained with Betadine or blood.  Do not wear any jewelry or perfume  Remove any nail coloring.  It will interfere with some of our monitoring equipment.  NOTE: Remember that this is not meant to be interpreted as a complete list of all possible complications.  Unforeseen problems may occur.  BLOOD THINNERS The following drugs contain aspirin or other products, which can cause increased bleeding during surgery and should not be taken for 2 weeks prior to and 1 week after surgery.  If you should need take something for relief of minor pain, you may take acetaminophen which is found in Tylenol,m Datril, Anacin-3 and Panadol. It is not blood thinner. The products listed below are.  Do not take any of the products listed below in addition to any listed on your instruction sheet.  A.P.C or A.P.C with Codeine Codeine Phosphate Capsules #3 Ibuprofen Ridaura  ABC compound Congesprin Imuran rimadil  Advil Cope Indocin Robaxisal  Alka-Seltzer Effervescent Pain Reliever and Antacid Coricidin or Coricidin-D  Indomethacin Rufen  Alka-Seltzer plus Cold Medicine Cosprin Ketoprofen S-A-C Tablets  Anacin Analgesic Tablets or Capsules Coumadin Korlgesic Salflex  Anacin Extra Strength Analgesic tablets or capsules CP-2 Tablets Lanoril Salicylate  Anaprox Cuprimine Capsules Levenox Salocol  Anexsia-D Dalteparin Magan Salsalate    Anodynos Darvon compound Magnesium Salicylate Sine-off  Ansaid Dasin Capsules Magsal Sodium Salicylate  Anturane Depen Capsules Marnal Soma  APF Arthritis pain formula Dewitt's Pills Measurin Stanback  Argesic Dia-Gesic Meclofenamic Sulfinpyrazone  Arthritis Bayer Timed Release Aspirin Diclofenac Meclomen Sulindac  Arthritis pain formula Anacin Dicumarol Medipren Supac  Analgesic (Safety coated) Arthralgen Diffunasal Mefanamic Suprofen  Arthritis Strength Bufferin Dihydrocodeine Mepro Compound Suprol  Arthropan liquid Dopirydamole Methcarbomol with Aspirin Synalgos  ASA tablets/Enseals Disalcid Micrainin Tagament  Ascriptin Doan's Midol Talwin  Ascriptin A/D Dolene Mobidin Tanderil  Ascriptin Extra Strength Dolobid Moblgesic Ticlid  Ascriptin with Codeine Doloprin or Doloprin with Codeine Momentum Tolectin  Asperbuf Duoprin Mono-gesic Trendar  Aspergum Duradyne Motrin or Motrin IB Triminicin  Aspirin plain, buffered or enteric coated Durasal Myochrisine Trigesic  Aspirin Suppositories Easprin Nalfon Trillsate  Aspirin with Codeine Ecotrin Regular or Extra Strength Naprosyn Uracel  Atromid-S Efficin Naproxen Ursinus  Auranofin Capsules Elmiron Neocylate Vanquish  Axotal Emagrin Norgesic Verin  Azathioprine Empirin or Empirin with Codeine Normiflo Vitamin E  Azolid Emprazil Nuprin Voltaren  Bayer Aspirin plain, buffered or children's or timed BC Tablets or powders Encaprin Orgaran Warfarin Sodium  Buff-a-Comp Enoxaparin Orudis Zorpin  Buff-a-Comp with Codeine Equegesic Os-Cal-Gesic   Buffaprin Excedrin plain, buffered or Extra Strength Oxalid   Bufferin  Arthritis Strength Feldene Oxphenbutazone   Bufferin plain or Extra Strength Feldene Capsules Oxycodone with Aspirin   Bufferin with Codeine Fenoprofen Fenoprofen Pabalate or Pabalate-SF   Buffets II Flogesic Panagesic   Buffinol plain or Extra Strength Florinal or Florinal with Codeine Panwarfarin   Buf-Tabs Flurbiprofen  Penicillamine   Butalbital Compound Four-way cold tablets Penicillin   Butazolidin Fragmin Pepto-Bismol   Carbenicillin Geminisyn Percodan   Carna Arthritis Reliever Geopen Persantine   Carprofen Gold's salt Persistin   Chloramphenicol Goody's Phenylbutazone   Chloromycetin Haltrain Piroxlcam   Clmetidine heparin Plaquenil   Cllnoril Hyco-pap Ponstel   Clofibrate Hydroxy chloroquine Propoxyphen         Before stopping any of these medications, be sure to consult the physician who ordered them.  Some, such as Coumadin (Warfarin) are ordered to prevent or treat serious conditions such as "deep thrombosis", "pumonary embolisms", and other heart problems.  The amount of time that you may need off of the medication may also vary with the medication and the reason for which you were taking it.  If you are taking any of these medications, please make sure you notify your pain physician before you undergo any procedures.

## 2018-02-10 NOTE — Progress Notes (Signed)
Safety precautions to be maintained throughout the outpatient stay will include: orient to surroundings, keep bed in low position, maintain call bell within reach at all times, provide assistance with transfer out of bed and ambulation.  

## 2018-02-10 NOTE — Patient Instructions (Signed)
Moderate Conscious Sedation, Adult Sedation is the use of medicines to promote relaxation and relieve discomfort and anxiety. Moderate conscious sedation is a type of sedation. Under moderate conscious sedation, you are less alert than normal, but you are still able to respond to instructions, touch, or both. Moderate conscious sedation is used during short medical and dental procedures. It is milder than deep sedation, which is a type of sedation under which you cannot be easily woken up. It is also milder than general anesthesia, which is the use of medicines to make you unconscious. Moderate conscious sedation allows you to return to your regular activities sooner. Tell a health care provider about:  Any allergies you have.  All medicines you are taking, including vitamins, herbs, eye drops, creams, and over-the-counter medicines.  Use of steroids (by mouth or creams).  Any problems you or family members have had with sedatives and anesthetic medicines.  Any blood disorders you have.  Any surgeries you have had.  Any medical conditions you have, such as sleep apnea.  Whether you are pregnant or may be pregnant.  Any use of cigarettes, alcohol, marijuana, or street drugs. What are the risks? Generally, this is a safe procedure. However, problems may occur, including:  Getting too much medicine (oversedation).  Nausea.  Allergic reaction to medicines.  Trouble breathing. If this happens, a breathing tube may be used to help with breathing. It will be removed when you are awake and breathing on your own.  Heart trouble.  Lung trouble. What happens before the procedure? Staying hydrated Follow instructions from your health care provider about hydration, which may include:  Up to 2 hours before the procedure - you may continue to drink clear liquids, such as water, clear fruit juice, black coffee, and plain tea. Eating and drinking restrictions Follow instructions from your  health care provider about eating and drinking, which may include:  8 hours before the procedure - stop eating heavy meals or foods such as meat, fried foods, or fatty foods.  6 hours before the procedure - stop eating light meals or foods, such as toast or cereal.  6 hours before the procedure - stop drinking milk or drinks that contain milk.  2 hours before the procedure - stop drinking clear liquids. Medicine Ask your health care provider about:  Changing or stopping your regular medicines. This is especially important if you are taking diabetes medicines or blood thinners.  Taking medicines such as aspirin and ibuprofen. These medicines can thin your blood. Do not take these medicines before your procedure if your health care provider instructs you not to.  Tests and exams  You will have a physical exam.  You may have blood tests done to show: ? How well your kidneys and liver are working. ? How well your blood can clot. General instructions  Plan to have someone take you home from the hospital or clinic.  If you will be going home right after the procedure, plan to have someone with you for 24 hours. What happens during the procedure?  An IV tube will be inserted into one of your veins.  Medicine to help you relax (sedative) will be given through the IV tube.  The medical or dental procedure will be performed. What happens after the procedure?  Your blood pressure, heart rate, breathing rate, and blood oxygen level will be monitored often until the medicines you were given have worn off.  Do not drive for 24 hours. This information is not   intended to replace advice given to you by your health care provider. Make sure you discuss any questions you have with your health care provider. Document Released: 10/14/2000 Document Revised: 06/25/2015 Document Reviewed: 05/11/2015 [employer] Interactive Patient Education  2019 [employer]   GENERAL RISKS AND  COMPLICATIONS  What are the risk, side effects and possible complications? Generally speaking, most procedures are safe.  However, with any procedure there are risks, side effects, and the possibility of complications.  The risks and complications are dependent upon the sites that are lesioned, or the type of nerve block to be performed.  The closer the procedure is to the spine, the more serious the risks are.  Great care is taken when placing the radio frequency needles, block needles or lesioning probes, but sometimes complications can occur. 1. Infection: Any time there is an injection through the skin, there is a risk of infection.  This is why sterile conditions are used for these blocks.  There are four possible types of infection. 1. Localized skin infection. 2. Central Nervous System Infection-This can be in the form of Meningitis, which can be deadly. 3. Epidural Infections-This can be in the form of an epidural abscess, which can cause pressure inside of the spine, causing compression of the spinal cord with subsequent paralysis. This would require an emergency surgery to decompress, and there are no guarantees that the patient would recover from the paralysis. 4. Discitis-This is an infection of the intervertebral discs.  It occurs in about 1% of discography procedures.  It is difficult to treat and it may lead to surgery.        2. Pain: the needles have to go through skin and soft tissues, will cause soreness.       3. Damage to internal structures:  The nerves to be lesioned may be near blood vessels or    other nerves which can be potentially damaged.       4. Bleeding: Bleeding is more common if the patient is taking blood thinners such as  aspirin, Coumadin, Ticiid, Plavix, etc., or if he/she have some genetic predisposition  such as hemophilia. Bleeding into the spinal canal can cause compression of the spinal  cord with subsequent paralysis.  This would require an emergency surgery  to  decompress and there are no guarantees that the patient would recover from the  paralysis.       5. Pneumothorax:  Puncturing of a lung is a possibility, every time a needle is introduced in  the area of the chest or upper back.  Pneumothorax refers to free air around the  collapsed lung(s), inside of the thoracic cavity (chest cavity).  Another two possible  complications related to a similar event would include: Hemothorax and Chylothorax.   These are variations of the Pneumothorax, where instead of air around the collapsed  lung(s), you may have blood or chyle, respectively.       6. Spinal headaches: They may occur with any procedures in the area of the spine.       7. Persistent CSF (Cerebro-Spinal Fluid) leakage: This is a rare problem, but may occur  with prolonged intrathecal or epidural catheters either due to the formation of a fistulous  track or a dural tear.       8. Nerve damage: By working so close to the spinal cord, there is always a possibility of  nerve damage, which could be as serious as a permanent spinal cord injury with  paralysis.  9. Death:  Although rare, severe deadly allergic reactions known as "Anaphylactic  reaction" can occur to any of the medications used.      10. Worsening of the symptoms:  We can always make thing worse.  What are the chances of something like this happening? Chances of any of this occuring are extremely low.  By statistics, you have more of a chance of getting killed in a motor vehicle accident: while driving to the hospital than any of the above occurring .  Nevertheless, you should be aware that they are possibilities.  In general, it is similar to taking a shower.  Everybody knows that you can slip, hit your head and get killed.  Does that mean that you should not shower again?  Nevertheless always keep in mind that statistics do not mean anything if you happen to be on the wrong side of them.  Even if a procedure has a 1 (one) in a 1,000,000  (million) chance of going wrong, it you happen to be that one..Also, keep in mind that by statistics, you have more of a chance of having something go wrong when taking medications.  Who should not have this procedure? If you are on a blood thinning medication (e.g. Coumadin, Plavix, see list of "Blood Thinners"), or if you have an active infection going on, you should not have the procedure.  If you are taking any blood thinners, please inform your physician.  How should I prepare for this procedure?  Do not eat or drink anything at least six hours prior to the procedure.  Bring a driver with you .  It cannot be a taxi.  Come accompanied by an adult that can drive you back, and that is strong enough to help you if your legs get weak or numb from the local anesthetic.  Take all of your medicines the morning of the procedure with just enough water to swallow them.  If you have diabetes, make sure that you are scheduled to have your procedure done first thing in the morning, whenever possible.  If you have diabetes, take only half of your insulin dose and notify our nurse that you have done so as soon as you arrive at the clinic.  If you are diabetic, but only take blood sugar pills (oral hypoglycemic), then do not take them on the morning of your procedure.  You may take them after you have had the procedure.  Do not take aspirin or any aspirin-containing medications, at least eleven (11) days prior to the procedure.  They may prolong bleeding.  Wear loose fitting clothing that may be easy to take off and that you would not mind if it got stained with Betadine or blood.  Do not wear any jewelry or perfume  Remove any nail coloring.  It will interfere with some of our monitoring equipment.  NOTE: Remember that this is not meant to be interpreted as a complete list of all possible complications.  Unforeseen problems may occur.  BLOOD THINNERS The following drugs contain aspirin or other  products, which can cause increased bleeding during surgery and should not be taken for 2 weeks prior to and 1 week after surgery.  If you should need take something for relief of minor pain, you may take acetaminophen which is found in Tylenol,m Datril, Anacin-3 and Panadol. It is not blood thinner. The products listed below are.  Do not take any of the products listed below in addition to any listed on  your instruction sheet.  A.P.C or A.P.C with Codeine Codeine Phosphate Capsules #3 Ibuprofen Ridaura  ABC compound Congesprin Imuran rimadil  Advil Cope Indocin Robaxisal  Alka-Seltzer Effervescent Pain Reliever and Antacid Coricidin or Coricidin-D  Indomethacin Rufen  Alka-Seltzer plus Cold Medicine Cosprin Ketoprofen S-A-C Tablets  Anacin Analgesic Tablets or Capsules Coumadin Korlgesic Salflex  Anacin Extra Strength Analgesic tablets or capsules CP-2 Tablets Lanoril Salicylate  Anaprox Cuprimine Capsules Levenox Salocol  Anexsia-D Dalteparin Magan Salsalate  Anodynos Darvon compound Magnesium Salicylate Sine-off  Ansaid Dasin Capsules Magsal Sodium Salicylate  Anturane Depen Capsules Marnal Soma  APF Arthritis pain formula Dewitt's Pills Measurin Stanback  Argesic Dia-Gesic Meclofenamic Sulfinpyrazone  Arthritis Bayer Timed Release Aspirin Diclofenac Meclomen Sulindac  Arthritis pain formula Anacin Dicumarol Medipren Supac  Analgesic (Safety coated) Arthralgen Diffunasal Mefanamic Suprofen  Arthritis Strength Bufferin Dihydrocodeine Mepro Compound Suprol  Arthropan liquid Dopirydamole Methcarbomol with Aspirin Synalgos  ASA tablets/Enseals Disalcid Micrainin Tagament  Ascriptin Doan's Midol Talwin  Ascriptin A/D Dolene Mobidin Tanderil  Ascriptin Extra Strength Dolobid Moblgesic Ticlid  Ascriptin with Codeine Doloprin or Doloprin with Codeine Momentum Tolectin  Asperbuf Duoprin Mono-gesic Trendar  Aspergum Duradyne Motrin or Motrin IB Triminicin  Aspirin plain, buffered or enteric  coated Durasal Myochrisine Trigesic  Aspirin Suppositories Easprin Nalfon Trillsate  Aspirin with Codeine Ecotrin Regular or Extra Strength Naprosyn Uracel  Atromid-S Efficin Naproxen Ursinus  Auranofin Capsules Elmiron Neocylate Vanquish  Axotal Emagrin Norgesic Verin  Azathioprine Empirin or Empirin with Codeine Normiflo Vitamin E  Azolid Emprazil Nuprin Voltaren  Bayer Aspirin plain, buffered or children's or timed BC Tablets or powders Encaprin Orgaran Warfarin Sodium  Buff-a-Comp Enoxaparin Orudis Zorpin  Buff-a-Comp with Codeine Equegesic Os-Cal-Gesic   Buffaprin Excedrin plain, buffered or Extra Strength Oxalid   Bufferin Arthritis Strength Feldene Oxphenbutazone   Bufferin plain or Extra Strength Feldene Capsules Oxycodone with Aspirin   Bufferin with Codeine Fenoprofen Fenoprofen Pabalate or Pabalate-SF   Buffets II Flogesic Panagesic   Buffinol plain or Extra Strength Florinal or Florinal with Codeine Panwarfarin   Buf-Tabs Flurbiprofen Penicillamine   Butalbital Compound Four-way cold tablets Penicillin   Butazolidin Fragmin Pepto-Bismol   Carbenicillin Geminisyn Percodan   Carna Arthritis Reliever Geopen Persantine   Carprofen Gold's salt Persistin   Chloramphenicol Goody's Phenylbutazone   Chloromycetin Haltrain Piroxlcam   Clmetidine heparin Plaquenil   Cllnoril Hyco-pap Ponstel   Clofibrate Hydroxy chloroquine Propoxyphen         Before stopping any of these medications, be sure to consult the physician who ordered them.  Some, such as Coumadin (Warfarin) are ordered to prevent or treat serious conditions such as "deep thrombosis", "pumonary embolisms", and other heart problems.  The amount of time that you may need off of the medication may also vary with the medication and the reason for which you were taking it.  If you are taking any of these medications, please make sure you notify your pain physician before you undergo any procedures.

## 2018-02-21 ENCOUNTER — Ambulatory Visit (HOSPITAL_BASED_OUTPATIENT_CLINIC_OR_DEPARTMENT_OTHER): Payer: 59 | Admitting: Student in an Organized Health Care Education/Training Program

## 2018-02-21 ENCOUNTER — Encounter: Payer: Self-pay | Admitting: Student in an Organized Health Care Education/Training Program

## 2018-02-21 ENCOUNTER — Ambulatory Visit
Admission: RE | Admit: 2018-02-21 | Discharge: 2018-02-21 | Disposition: A | Discharge status: Home / Self Care | Payer: 59 | Source: Ambulatory Visit | Attending: Student in an Organized Health Care Education/Training Program | Admitting: Student in an Organized Health Care Education/Training Program

## 2018-02-21 ENCOUNTER — Other Ambulatory Visit: Payer: Self-pay

## 2018-02-21 DIAGNOSIS — M170 Bilateral primary osteoarthritis of knee: Secondary | ICD-10-CM

## 2018-02-21 MED ORDER — DEXAMETHASONE SODIUM PHOSPHATE 10 MG/ML IJ SOLN
10.0000 mg | Freq: Once | INTRAMUSCULAR | Status: AC
  Administered 2018-02-21: 10 mg

## 2018-02-21 MED ORDER — LIDOCAINE HCL 2 % IJ SOLN
INTRAMUSCULAR | Status: AC
  Filled 2018-02-21: qty 20

## 2018-02-21 MED ORDER — LACTATED RINGERS IV SOLN
1000.0000 mL | Freq: Once | INTRAVENOUS | Status: AC
  Administered 2018-02-21: 1000 mL via INTRAVENOUS

## 2018-02-21 MED ORDER — FENTANYL CITRATE (PF) 100 MCG/2ML IJ SOLN
25.0000 ug | INTRAMUSCULAR | Status: DC | PRN
  Administered 2018-02-21: 50 ug via INTRAVENOUS

## 2018-02-21 MED ORDER — FENTANYL CITRATE (PF) 100 MCG/2ML IJ SOLN
INTRAMUSCULAR | Status: AC
  Filled 2018-02-21: qty 2

## 2018-02-21 MED ORDER — ROPIVACAINE HCL 2 MG/ML IJ SOLN
INTRAMUSCULAR | Status: AC
  Filled 2018-02-21: qty 10

## 2018-02-21 MED ORDER — ROPIVACAINE HCL 2 MG/ML IJ SOLN
10.0000 mL | Freq: Once | INTRAMUSCULAR | Status: AC
  Administered 2018-02-21: 10 mL

## 2018-02-21 MED ORDER — DEXAMETHASONE SODIUM PHOSPHATE 10 MG/ML IJ SOLN
INTRAMUSCULAR | Status: AC
  Filled 2018-02-21: qty 1

## 2018-02-21 MED ORDER — LIDOCAINE HCL 2 % IJ SOLN
20.0000 mL | Freq: Once | INTRAMUSCULAR | Status: AC
  Administered 2018-02-21: 400 mg

## 2018-02-21 NOTE — Progress Notes (Signed)
Safety precautions to be maintained throughout the outpatient stay will include: orient to surroundings, keep bed in low position, maintain call bell within reach at all times, provide assistance with transfer out of bed and ambulation.  

## 2018-02-21 NOTE — Progress Notes (Signed)
Patient's Name: Darryl Payne  MRN: [id number]  Referring Provider: Edward Jolly, MD  DOB: 21-Oct-1960  PCP: Asencion Gowda.August Saucer, MD  DOS: 02/21/2018  Note by: Edward Jolly, MD  Service setting: Ambulatory outpatient  Specialty: Interventional Pain Management  Patient type: Established  Location: [facility] (AMB) Pain Management Facility  Visit type: Interventional Procedure   Primary Reason for Visit: Interventional Pain Management Treatment. CC: Knee Pain (bilateral)  Procedure:          Anesthesia, Analgesia, Anxiolysis:  Type: Genicular Nerves Block (Superior-lateral, Superior-medial, and Inferior-medial Genicular Nerves) #1  CPT: 64450      Primary Purpose: Diagnostic Region: Lateral, Anterior, and Medial aspects of the knee joint, above and below the knee joint proper. Level: Superior and inferior to the knee joint. Target Area: For Genicular Nerve block(s), the targets are: the superior-lateral genicular nerve, located in the lateral distal portion of the femoral shaft as it curves to form the lateral epicondyle, in the region of the distal femoral metaphysis; the superior-medial genicular nerve, located in the medial distal portion of the femoral shaft as it curves to form the medial epicondyle; and the inferior-medial genicular nerve, located in the medial, proximal portion of the tibial shaft, as it curves to form the medial epicondyle, in the region of the proximal tibial metaphysis. Approach: Anterior, percutaneous, ipsilateral approach. Laterality: Bilateral  Type: Moderate (Conscious) Sedation combined with Local Anesthesia Indication(s): Analgesia and Anxiety Route: Intravenous (IV) IV Access: Secured Sedation: Meaningful verbal contact was maintained at all times during the procedure  Local Anesthetic: Lidocaine 1-2%  Position: Modified Fowler's position with pillows under the targeted knee(s).   Indications: 1. Bilateral primary osteoarthritis of knee    Pain  Score: Pre-procedure: 4 /10 Post-procedure: 0-No pain/10  Pre-op Assessment:  Darryl Payne is a 58 y.o. (year old), male patient, seen today for interventional treatment. 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 Darryl Payne has a current medication list which includes the following prescription(s): amlodipine-valsartan-hctz, ascorbic acid, carboxymethylcellulose sodium, vitamin d3, fish oil-omega-3 fatty acids, flaxseed (linseed), ginkgo biloba, ibuprofen, multivitamin with minerals, pantoprazole, sildenafil, sildenafil, docusate sodium, and oxycodone-acetaminophen, and the following Facility-Administered Medications: fentanyl. His primarily concern today is the Knee Pain (bilateral)  Initial Vital Signs:  Pulse/HCG Rate: 72ECG Heart Rate: 76 Temp: 98.5 F (36.9 C) Resp: 16 BP: (!) 142/77 SpO2: 99 %  BMI: Estimated body mass index is 33.37 kg/m as calculated from the following:   Height as of this encounter: 5\' 9"  (1.753 m).   Weight as of this encounter: 226 lb (102.5 kg).  Risk Assessment: Allergies: Reviewed. He is allergic to oxycodone.  Allergy Precautions: None required Coagulopathies: Reviewed. None identified.  Blood-thinner therapy: None at this time Active Infection(s): Reviewed. None identified. Darryl Payne is afebrile  Site Confirmation: Darryl Payne was asked to confirm the procedure and laterality before marking the site Procedure checklist: Completed Consent: Before the procedure and under the influence of no sedative(s), amnesic(s), or anxiolytics, the patient was informed of the treatment options, risks and possible complications. To fulfill our ethical and legal obligations, as recommended by the American Medical  Association's Code of Ethics, I have informed the patient of my clinical impression; the nature and purpose of the treatment or procedure; the risks, benefits, and possible complications of the intervention; the alternatives, including doing nothing; the risk(s)  and benefit(s) of the alternative treatment(s) or procedure(s); and the risk(s) and benefit(s) of doing nothing. The patient was provided information about the general risks and possible complications associated with the procedure. These may include, but are not limited to: failure to achieve desired goals, infection, bleeding, organ or nerve damage, allergic reactions, paralysis, and death. In addition, the patient was informed of those risks and complications associated to the procedure, such as failure to decrease pain; infection; bleeding; organ or nerve damage with subsequent damage to sensory, motor, and/or autonomic systems, resulting in permanent pain, numbness, and/or weakness of one or several areas of the body; allergic reactions; (i.e.: anaphylactic reaction); and/or death. Furthermore, the patient was informed of those risks and complications associated with the medications. These include, but are not limited to: allergic reactions (i.e.: anaphylactic or anaphylactoid reaction(s)); adrenal axis suppression; blood sugar elevation that in diabetics may result in ketoacidosis or comma; water retention that in patients with history of congestive heart failure may result in shortness of breath, pulmonary edema, and decompensation with resultant heart failure; weight gain; swelling or edema; medication-induced neural toxicity; particulate matter embolism and blood vessel occlusion with resultant organ, and/or nervous system infarction; and/or aseptic necrosis of one or more joints. Finally, the patient was informed that Medicine is not an exact science; therefore, there is also the possibility of unforeseen or unpredictable risks and/or possible  complications that may result in a catastrophic outcome. The patient indicated having understood very clearly. We have given the patient no guarantees and we have made no promises. Enough time was given to the patient to ask questions, all of which were answered to the patient's satisfaction. Mr. Fjeld has indicated that he wanted to continue with the procedure. Attestation: I, the ordering provider, attest that I have discussed with the patient the benefits, risks, side-effects, alternatives, likelihood of achieving goals, and potential problems during recovery for the procedure that I have provided informed consent. Date  Time: 02/21/2018  9:17 AM  Pre-Procedure Preparation:  Monitoring: As per clinic protocol. Respiration, ETCO2, SpO2, BP, heart rate and rhythm monitor placed and checked for adequate function Safety Precautions: Patient was assessed for positional comfort and pressure points before starting the procedure. Time-out: I initiated and conducted the "Time-out" before starting the procedure, as per protocol. The patient was asked to participate by confirming the accuracy of the "Time Out" information. Verification of the correct person, site, and procedure were performed and confirmed by me, the nursing staff, and the patient. "Time-out" conducted as per Joint Commission's Universal Protocol (UP.01.01.01). Time: 0949  Description of Procedure:          Area Prepped: Entire knee area, from mid-thigh to mid-shin, lateral, anterior, and medial aspects. Prepping solution: ChloraPrep (2% chlorhexidine gluconate and 70% isopropyl alcohol) Safety Precautions: Aspiration looking for blood return was conducted prior to all injections. At no point did we inject any substances, as a needle was being advanced. No attempts were made at seeking any paresthesias. Safe injection practices and needle disposal techniques used. Medications properly checked for expiration dates. SDV (single dose vial)  medications used. Description of the Procedure: Protocol guidelines were followed. The patient was placed in position over the procedure table. The target area was identified and the area prepped in the usual manner. Skin & deeper tissues infiltrated with local anesthetic. Appropriate amount of time allowed to pass for local anesthetics to take effect. The procedure needles were then advanced to the target area. Proper needle placement secured. Negative aspiration  confirmed. Solution injected in intermittent fashion, asking for systemic symptoms every 0.5cc of injectate. The needles were then removed and the area cleansed, making sure to leave some of the prepping solution back to take advantage of its long term bactericidal properties.  Vitals:   02/21/18 1009 02/21/18 1019 02/21/18 1029 02/21/18 1039  BP: 131/89 122/87 128/86 124/81  Pulse: 85     Resp: 16 19 15 16   Temp:  98 F (36.7 C)  98.1 F (36.7 C)  TempSrc:      SpO2: 98% 99% 100% 99%  Weight:      Height:        Start Time: 0949 hrs. End Time: 1006 hrs. Materials:  Needle(s) Type: Spinal Needle Gauge: 25G Length: 3.5-in Medication(s): Please see orders for medications and dosing details. 5 cc solution made of 4 cc of 0.2% ropivacaine, 1 cc of Decadron 10 mg/cc.  1.5 cc injected at each level on the right. 5 cc solution made of 4 cc of 0.2% ropivacaine, 1 cc of Decadron 10 mg/cc.  1.5 cc injected at each level on the left.  Total steroid dose: 20 mg of Decadron Imaging Guidance (Non-Spinal):          Type of Imaging Technique: Fluoroscopy Guidance (Non-Spinal) Indication(s): Assistance in needle guidance and placement for procedures requiring needle placement in or near specific anatomical locations not easily accessible without such assistance. Exposure Time: Please see nurses notes. Contrast: None used. Fluoroscopic Guidance: I was personally present during the use of fluoroscopy. "Tunnel Vision Technique" used to obtain  the best possible view of the target area. Parallax error corrected before commencing the procedure. "Direction-depth-direction" technique used to introduce the needle under continuous pulsed fluoroscopy. Once target was reached, antero-posterior, oblique, and lateral fluoroscopic projection used confirm needle placement in all planes. Images permanently stored in EMR. Interpretation: No contrast injected. I personally interpreted the imaging intraoperatively. Adequate needle placement confirmed in multiple planes. Permanent images saved into the patient's record.  Antibiotic Prophylaxis:   Anti-infectives (From admission, onward)   None     Indication(s): None identified  Post-operative Assessment:  Post-procedure Vital Signs:  Pulse/HCG Rate: 8574 Temp: 98.1 F (36.7 C) Resp: 16 BP: 124/81 SpO2: 99 %  EBL: None  Complications: No immediate post-treatment complications observed by team, or reported by patient.  Note: The patient tolerated the entire procedure well. A repeat set of vitals were taken after the procedure and the patient was kept under observation following institutional policy, for this type of procedure. Post-procedural neurological assessment was performed, showing return to baseline, prior to discharge. The patient was provided with post-procedure discharge instructions, including a section on how to identify potential problems. Should any problems arise concerning this procedure, the patient was given instructions to immediately contact us, at any time, without hesitation. In any case, we plan to contact the patient by telephone for a follow-up status report regarding this interventional procedure.  Comments:  No additional relevant information.  Plan of Care    Imaging Orders     DG C-Arm 1-60 Min-No Report Procedure Orders    No procedure(s) ordered today    Medications ordered for procedure: Meds ordered this encounter  Medications  . lactated ringers  infusion 1,000 mL  . fentaNYL (SUBLIMAZE) injection 25-100 mcg    Make sure Narcan is available in the pyxis when using this medication. In the event of respiratory depression (RR< 8/min): Titrate NARCAN (naloxone) in increments of 0.1 to 0.2 mg IV at 2-3  minute intervals, until desired degree of reversal.  . ropivacaine (PF) 2 mg/mL (0.2%) (NAROPIN) injection 10 mL  . lidocaine (XYLOCAINE) 2 % (with pres) injection 400 mg  . dexamethasone (DECADRON) injection 10 mg  . dexamethasone (DECADRON) injection 10 mg   Medications administered: We administered lactated ringers, fentaNYL, ropivacaine (PF) 2 mg/mL (0.2%), lidocaine, dexamethasone, and dexamethasone.  See the medical record for exact dosing, route, and time of administration.  Disposition: Discharge home  Discharge Date & Time: 02/21/2018; 1040 hrs.   Physician-requested Follow-up: Return in about 4 weeks (around 03/21/2018) for Post Procedure Evaluation.  Future Appointments  Date Time Provider Department Center  03/22/2018  9:30 AM Edward JollyLateef, Aasim Restivo, MD [facility] None   Primary Care Physician: Clovis RileyMitchell, L.August Saucerean, MD [facility] Note by: Edward JollyBilal Ennifer Harston, MD Date: 02/21/2018; Time: 12:28 PM  Disclaimer:  Medicine is not an exact science. The only guarantee in medicine is that nothing is guaranteed. It is important to note that the decision to proceed with this intervention was based on the information collected from the patient. The Data and conclusions were drawn from the patient's questionnaire, the interview, and the physical examination. Because the information was provided in large part by the patient, it cannot be guaranteed that it has not been purposely or unconsciously manipulated. Every effort has been made to obtain as much relevant data as possible for this evaluation. It is important to note that the conclusions that lead to this procedure are derived in large part from the available data.  Always take into account that the treatment will also be dependent on availability of resources and existing treatment guidelines, considered by other Pain Management Practitioners as being common knowledge and practice, at the time of the intervention. For Medico-Legal purposes, it is also important to point out that variation in procedural techniques and pharmacological choices are the acceptable norm. The indications, contraindications, technique, and results of the above procedure should only be interpreted and judged by a Board-Certified Interventional Pain Specialist with extensive familiarity and expertise in the same exact procedure and technique.

## 2018-02-22 ENCOUNTER — Telehealth: Payer: Self-pay | Admitting: *Deleted

## 2018-02-22 NOTE — Telephone Encounter (Signed)
No problems post procedure. 

## 2018-03-22 ENCOUNTER — Ambulatory Visit: Payer: 59 | Admitting: Student in an Organized Health Care Education/Training Program

## 2018-04-22 ENCOUNTER — Telehealth: Payer: Self-pay

## 2018-04-22 NOTE — Telephone Encounter (Signed)
Post procedure evaluation 02-21-2018  Bilateral GNB   1st hour  100% 4-6 hrs    100% Longterm  100% lasting  3 days then returned to pre procedure status.

## 2018-04-25 ENCOUNTER — Ambulatory Visit: Payer: 59 | Admitting: Student in an Organized Health Care Education/Training Program

## 2018-05-28 IMAGING — CR DG CHEST 2V
2 series · 2 of 2 positions shown · non-contrast
Comparison: 11/19/2011.

CLINICAL DATA: Left shoulder replacement.

EXAM:
CHEST  2 VIEW

[w chest pa]
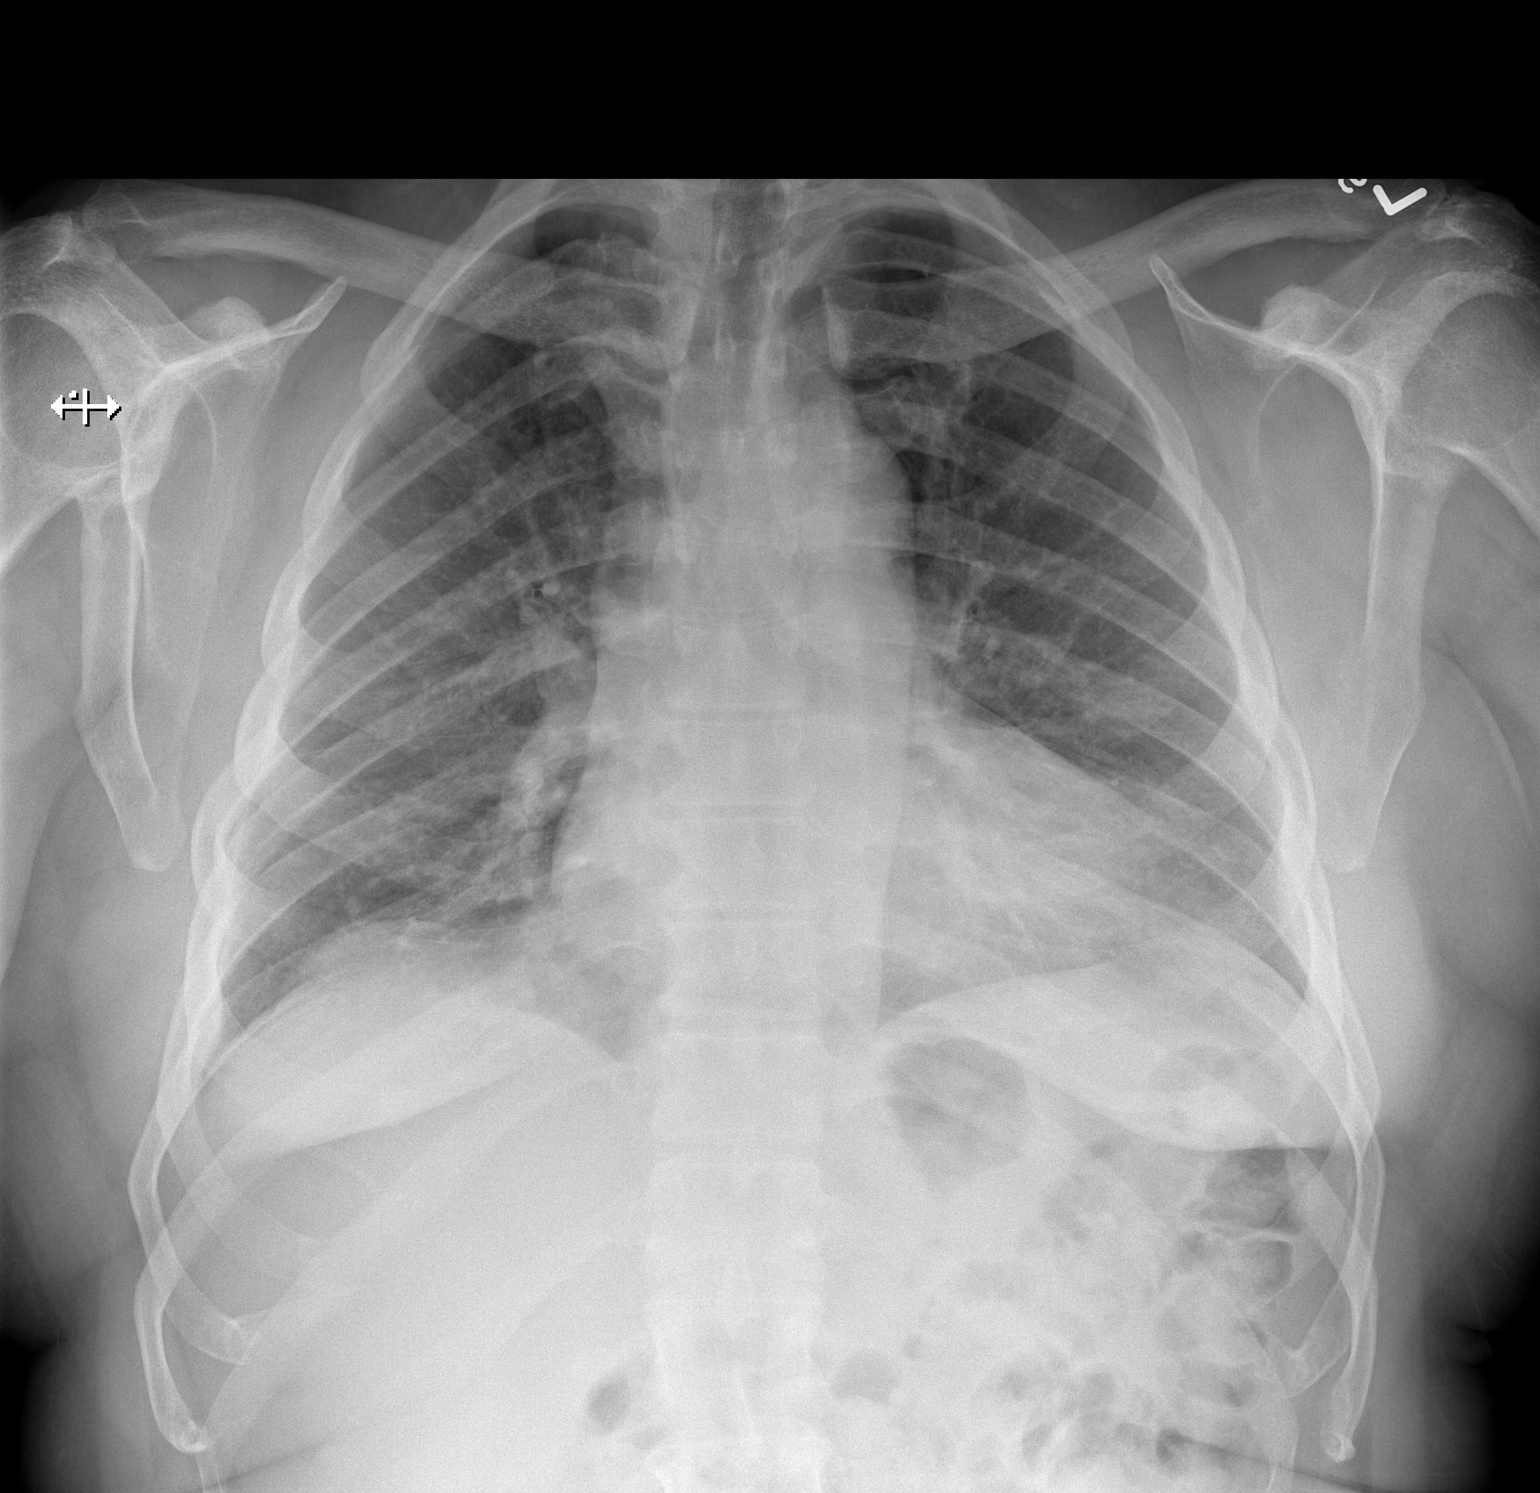

[w chest lat]
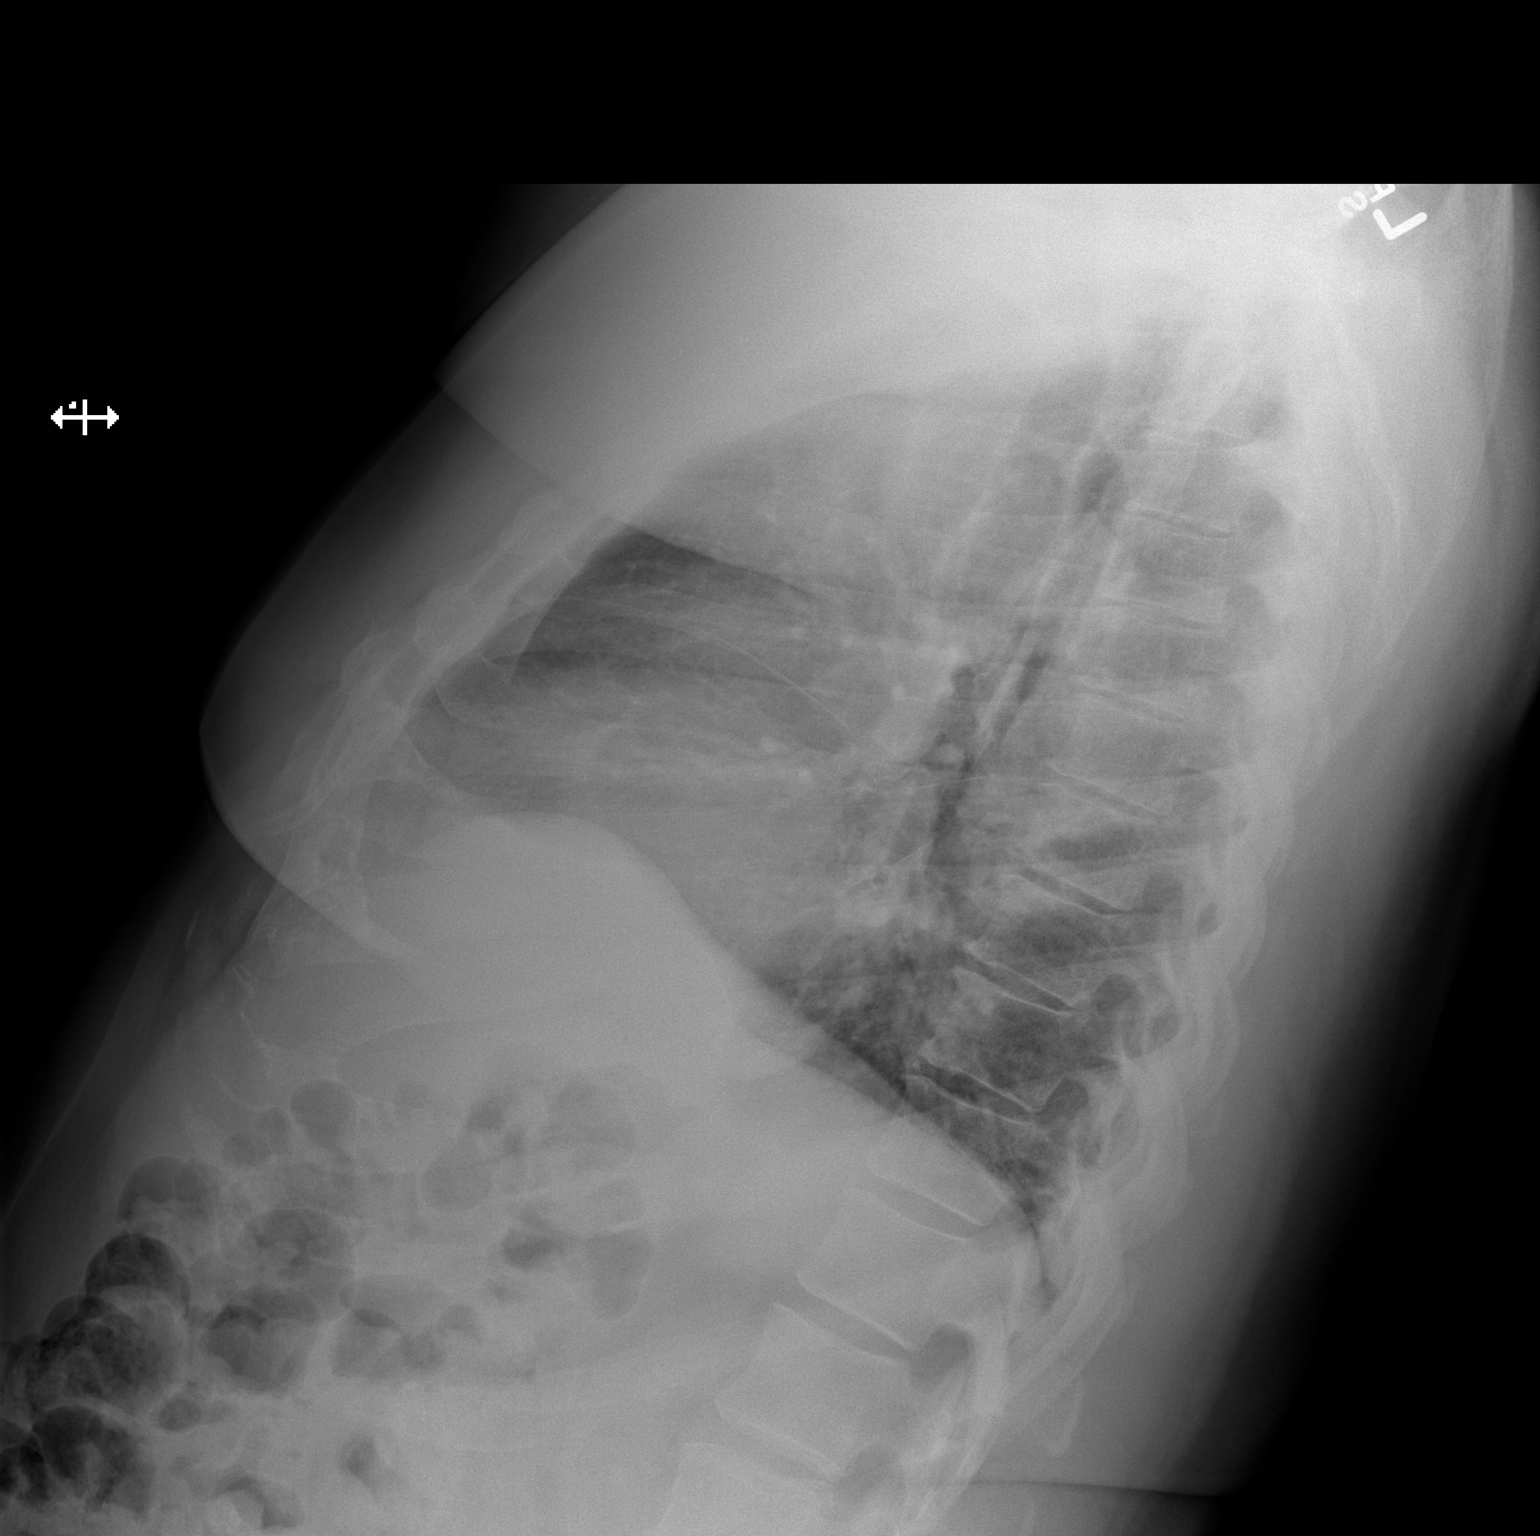

[2 of 2 positions shown; findings below may reference images not displayed]

FINDINGS: Mediastinum hilar structures normal. Cardiomegaly. Mild bilateral
interstitial prominence. No pleural effusion or pneumothorax.
Degenerative changes thoracic spine.
IMPRESSION: Cardiomegaly. Mild bilateral interstitial prominence. Mild CHF
cannot be excluded .

## 2018-06-14 ENCOUNTER — Other Ambulatory Visit: Payer: Self-pay

## 2018-06-14 ENCOUNTER — Encounter
Admission: RE | Admit: 2018-06-14 | Discharge: 2018-06-14 | Disposition: A | Discharge status: Home / Self Care | Payer: 59 | Source: Ambulatory Visit | Attending: Orthopedic Surgery | Admitting: Orthopedic Surgery

## 2018-06-14 DIAGNOSIS — Z1159 Encounter for screening for other viral diseases: Secondary | ICD-10-CM | POA: Insufficient documentation

## 2018-06-14 DIAGNOSIS — Z01818 Encounter for other preprocedural examination: Secondary | ICD-10-CM | POA: Insufficient documentation

## 2018-06-14 DIAGNOSIS — I10 Essential (primary) hypertension: Secondary | ICD-10-CM

## 2018-06-14 LAB — URINALYSIS, ROUTINE W REFLEX MICROSCOPIC
Bilirubin Urine: NEGATIVE
Glucose, UA: NEGATIVE mg/dL
Hgb urine dipstick: NEGATIVE
Ketones, ur: NEGATIVE mg/dL
Leukocytes,Ua: NEGATIVE
Nitrite: NEGATIVE
Protein, ur: NEGATIVE mg/dL
Specific Gravity, Urine: 1.016 (ref 1.005–1.030)
pH: 7 (ref 5.0–8.0)

## 2018-06-14 LAB — CBC
HCT: 40.3 % (ref 39.0–52.0)
Hemoglobin: 13.7 g/dL (ref 13.0–17.0)
MCH: 29.5 pg (ref 26.0–34.0)
MCHC: 34 g/dL (ref 30.0–36.0)
MCV: 86.7 fL (ref 80.0–100.0)
Platelets: 198 10*3/uL (ref 150–400)
RBC: 4.65 MIL/uL (ref 4.22–5.81)
RDW: 13.4 % (ref 11.5–15.5)
WBC: 8.4 10*3/uL (ref 4.0–10.5)
nRBC: 0 % (ref 0.0–0.2)

## 2018-06-14 LAB — COMPREHENSIVE METABOLIC PANEL
ALT: 17 U/L (ref 0–44)
AST: 19 U/L (ref 15–41)
Albumin: 3.9 g/dL (ref 3.5–5.0)
Alkaline Phosphatase: 62 U/L (ref 38–126)
Anion gap: 10 (ref 5–15)
BUN: 16 mg/dL (ref 6–20)
CO2: 25 mmol/L (ref 22–32)
Calcium: 8.9 mg/dL (ref 8.9–10.3)
Chloride: 104 mmol/L (ref 98–111)
Creatinine, Ser: 0.82 mg/dL (ref 0.61–1.24)
GFR calc Af Amer: >60 mL/min (ref >60)
GFR calc non Af Amer: >60 mL/min (ref >60)
Glucose, Bld: 104 mg/dL — ABNORMAL HIGH (ref 70–99)
Potassium: 3.1 mmol/L — ABNORMAL LOW (ref 3.5–5.1)
Sodium: 139 mmol/L (ref 135–145)
Total Bilirubin: 0.7 mg/dL (ref 0.3–1.2)
Total Protein: 7.4 g/dL (ref 6.5–8.1)

## 2018-06-14 LAB — TYPE AND SCREEN
ABO/RH(D): B POS
Antibody Screen: NEGATIVE

## 2018-06-14 LAB — SURGICAL PCR SCREEN
MRSA, PCR: NEGATIVE
Staphylococcus aureus: NEGATIVE

## 2018-06-14 LAB — SEDIMENTATION RATE: Sed Rate: 16 mm/hr (ref 0–20)

## 2018-06-14 LAB — PROTIME-INR
INR: 1 (ref 0.8–1.2)
Prothrombin Time: 13.5 seconds (ref 11.4–15.2)

## 2018-06-14 LAB — C-REACTIVE PROTEIN: CRP: <0.8 mg/dL (ref <1.0)

## 2018-06-14 LAB — APTT: aPTT: 27 seconds (ref 24–36)

## 2018-06-14 NOTE — Pre-Procedure Instructions (Signed)
Messaged Dr Noralyn Pick regarding abnormal EKG. He compared it with 2013 EKG and said it was essentially the same so he said ok to proceed with surgery

## 2018-06-14 NOTE — Patient Instructions (Signed)
Your procedure is scheduled on: 06-17-18 FRIDAY Report to Same Day Surgery 2nd floor medical mall [facility](Medical Mall Entrance-take elevator on left to 2nd floor.  Check in with surgery information desk.) To find out your arrival time please call [phone] between 1PM - 3PM on 06-16-18 THURSDAY  Remember: Instructions that are not followed completely may result in serious medical risk, up to and including death, or upon the discretion of your surgeon and anesthesiologist your surgery may need to be rescheduled.    _x___ 1. Do not eat food after midnight the night before your procedure. NO GUM OR CANDY AFTER MIDNIGHT. You may drink clear liquids up to 2 hours before you are scheduled to arrive at the hospital for your procedure.  Do not drink clear liquids within 2 hours of your scheduled arrival to the hospital.  Clear liquids include  --Water or Apple juice without pulp  --Clear carbohydrate beverage such as ClearFast or Gatorade  --Black Coffee or Clear Tea (No milk, no creamers, do not add anything to the coffee or Tea   ____Ensure clear carbohydrate drink on the way to the hospital for bariatric patients  ____Ensure clear carbohydrate drink 3 hours before surgery for Dr Rutherford NailByrnett's patients if physician instructed.    __x__ 2. No Alcohol for 24 hours before or after surgery.   __x__3. No Smoking or e-cigarettes for 24 prior to surgery.  Do not use any chewable tobacco products for at least 6 hour prior to surgery   ____  4. Bring all medications with you on the day of surgery if instructed.    __x__ 5. Notify your doctor if there is any change in your medical condition     (cold, fever, infections).    x___6. On the morning of surgery brush your teeth with toothpaste and water.  You may rinse your mouth with mouth wash if you wish.  Do not swallow any toothpaste or mouthwash.   Do not wear jewelry, make-up, hairpins, clips or nail polish.  Do not wear lotions, powders, or perfumes. You  may wear deodorant.  Do not shave 48 hours prior to surgery. Men may shave face and neck.  Do not bring valuables to the hospital.    [facility] for any belongings or valuables.               Contacts, dentures or bridgework may not be worn into surgery.  Leave your suitcase in the car. After surgery it may be brought to your room.  For patients admitted to the hospital, discharge time is determined by your treatment team.  _  Patients discharged the day of surgery will not be allowed to drive home.  You will need someone to drive you home and stay with you the night of your procedure.    Please read over the following fact sheets that you were given:   [facility] Preparing for Surgery and or MRSA Information   _x___ TAKE THE FOLLOWING MEDICATION THE MORNING OF SURGERY WITH A SMALL SIP OF WATER. These include:  1. PROTONIX (PANTOPRAZOLE)  2.  3.  4.  5.  6.  ____Fleets enema or Magnesium Citrate as directed.   _x___ Use CHG Soap or sage wipes as directed on instruction sheet   ____ Use inhalers on the day of surgery and bring to hospital day of surgery  ____ Stop Metformin and Janumet 2 days prior to surgery.    ____ Take 1/2 of usual insulin dose  the night before surgery and none on the morning surgery.   ____ Follow recommendations from Cardiologist, Pulmonologist or PCP regarding stopping Aspirin, Coumadin, Plavix ,Eliquis, Effient, or Pradaxa, and Pletal.  X____Stop Anti-inflammatories such as Advil, Aleve, Ibuprofen, Motrin, Naproxen, Naprosyn, Goodies powders or aspirin products NOW-OK to take Tylenol   _x___ Stop supplements until after surgery-STOP AIRBORNE NOW-MAY RESUME AFTER SURGERY  ____ Bring C-Pap to the hospital.

## 2018-06-15 LAB — URINE CULTURE
Culture: NO GROWTH
Special Requests: NORMAL

## 2018-06-15 LAB — NOVEL CORONAVIRUS, NAA (HOSP ORDER, SEND-OUT TO REF LAB; TAT 18-24 HRS): SARS-CoV-2, NAA: NOT DETECTED

## 2018-06-16 MED ORDER — CEFAZOLIN SODIUM-DEXTROSE 2-4 GM/100ML-% IV SOLN
2.0000 g | INTRAVENOUS | Status: AC
  Administered 2018-06-17 (×2): 2 g via INTRAVENOUS

## 2018-06-16 MED ORDER — TRANEXAMIC ACID-NACL 1000-0.7 MG/100ML-% IV SOLN
1000.0000 mg | INTRAVENOUS | Status: AC
  Administered 2018-06-17: 1000 mg via INTRAVENOUS
  Filled 2018-06-16: qty 100

## 2018-06-17 ENCOUNTER — Encounter: Payer: Self-pay | Admitting: Orthopedic Surgery

## 2018-06-17 ENCOUNTER — Other Ambulatory Visit: Payer: Self-pay

## 2018-06-17 ENCOUNTER — Inpatient Hospital Stay: Payer: 59 | Admitting: Anesthesiology

## 2018-06-17 ENCOUNTER — Inpatient Hospital Stay: Payer: 59

## 2018-06-17 ENCOUNTER — Encounter
Admission: RE | Disposition: A | Discharge status: Home Health | Payer: Self-pay | Source: Home / Self Care | Attending: Orthopedic Surgery

## 2018-06-17 ENCOUNTER — Inpatient Hospital Stay
Admission: RE | Admit: 2018-06-17 | Discharge: 2018-06-19 | DRG: 983 | Disposition: A | Discharge status: Home Health | Payer: 59 | Attending: Orthopedic Surgery | Admitting: Orthopedic Surgery

## 2018-06-17 DIAGNOSIS — G4730 Sleep apnea, unspecified: Secondary | ICD-10-CM | POA: Diagnosis present

## 2018-06-17 DIAGNOSIS — K219 Gastro-esophageal reflux disease without esophagitis: Principal | ICD-10-CM | POA: Diagnosis present

## 2018-06-17 DIAGNOSIS — Z833 Family history of diabetes mellitus: Secondary | ICD-10-CM | POA: Diagnosis not present

## 2018-06-17 DIAGNOSIS — Z1159 Encounter for screening for other viral diseases: Secondary | ICD-10-CM | POA: Diagnosis not present

## 2018-06-17 DIAGNOSIS — Z8249 Family history of ischemic heart disease and other diseases of the circulatory system: Secondary | ICD-10-CM | POA: Diagnosis not present

## 2018-06-17 DIAGNOSIS — M25562 Pain in left knee: Secondary | ICD-10-CM | POA: Diagnosis present

## 2018-06-17 DIAGNOSIS — Z807 Family history of other malignant neoplasms of lymphoid, hematopoietic and related tissues: Secondary | ICD-10-CM | POA: Diagnosis not present

## 2018-06-17 DIAGNOSIS — I10 Essential (primary) hypertension: Secondary | ICD-10-CM | POA: Diagnosis present

## 2018-06-17 DIAGNOSIS — Z885 Allergy status to narcotic agent status: Secondary | ICD-10-CM

## 2018-06-17 DIAGNOSIS — Z96652 Presence of left artificial knee joint: Secondary | ICD-10-CM

## 2018-06-17 DIAGNOSIS — Z96612 Presence of left artificial shoulder joint: Secondary | ICD-10-CM | POA: Diagnosis present

## 2018-06-17 DIAGNOSIS — Z96659 Presence of unspecified artificial knee joint: Secondary | ICD-10-CM

## 2018-06-17 HISTORY — PX: KNEE ARTHROPLASTY: SHX992

## 2018-06-17 LAB — ABO/RH: ABO/RH(D): B POS

## 2018-06-17 SURGERY — ARTHROPLASTY, KNEE, TOTAL, USING IMAGELESS COMPUTER-ASSISTED NAVIGATION
Anesthesia: Spinal | Site: Knee | Laterality: Left

## 2018-06-17 MED ORDER — GLYCOPYRROLATE 0.2 MG/ML IJ SOLN
INTRAMUSCULAR | Status: DC | PRN
  Administered 2018-06-17: 0.2 mg via INTRAVENOUS

## 2018-06-17 MED ORDER — DIPHENHYDRAMINE HCL 12.5 MG/5ML PO ELIX
12.5000 mg | ORAL_SOLUTION | ORAL | Status: DC | PRN
  Filled 2018-06-17: qty 10

## 2018-06-17 MED ORDER — LIDOCAINE HCL (PF) 2 % IJ SOLN
INTRAMUSCULAR | Status: DC | PRN
  Administered 2018-06-17: 50 mg

## 2018-06-17 MED ORDER — PANTOPRAZOLE SODIUM 40 MG PO TBEC
40.0000 mg | DELAYED_RELEASE_TABLET | Freq: Two times a day (BID) | ORAL | Status: DC
  Administered 2018-06-17 – 2018-06-19 (×4): 40 mg via ORAL
  Filled 2018-06-17 (×4): qty 1

## 2018-06-17 MED ORDER — IRBESARTAN 150 MG PO TABS
300.0000 mg | ORAL_TABLET | Freq: Every day | ORAL | Status: DC
  Administered 2018-06-17 – 2018-06-19 (×3): 300 mg via ORAL
  Filled 2018-06-17 (×3): qty 2

## 2018-06-17 MED ORDER — MENTHOL 3 MG MT LOZG: 1.0000 | LOZENGE | OROMUCOSAL | Status: DC | PRN

## 2018-06-17 MED ORDER — CELECOXIB 200 MG PO CAPS
200.0000 mg | ORAL_CAPSULE | Freq: Two times a day (BID) | ORAL | Status: DC
  Administered 2018-06-17 – 2018-06-19 (×4): 200 mg via ORAL
  Filled 2018-06-17 (×5): qty 1

## 2018-06-17 MED ORDER — GABAPENTIN 300 MG PO CAPS
300.0000 mg | ORAL_CAPSULE | Freq: Once | ORAL | Status: AC
  Administered 2018-06-17: 300 mg via ORAL

## 2018-06-17 MED ORDER — KETAMINE HCL 50 MG/ML IJ SOLN
INTRAMUSCULAR | Status: AC
  Filled 2018-06-17: qty 10

## 2018-06-17 MED ORDER — PHENYLEPHRINE HCL (PRESSORS) 10 MG/ML IV SOLN
INTRAVENOUS | Status: AC
  Filled 2018-06-17: qty 1

## 2018-06-17 MED ORDER — MIDAZOLAM HCL 5 MG/5ML IJ SOLN
INTRAMUSCULAR | Status: DC | PRN
  Administered 2018-06-17 (×2): 2 mg via INTRAVENOUS

## 2018-06-17 MED ORDER — DEXAMETHASONE SODIUM PHOSPHATE 10 MG/ML IJ SOLN
8.0000 mg | Freq: Once | INTRAMUSCULAR | Status: AC
  Administered 2018-06-17: 8 mg via INTRAVENOUS

## 2018-06-17 MED ORDER — BUPIVACAINE LIPOSOME 1.3 % IJ SUSP
INTRAMUSCULAR | Status: AC
  Filled 2018-06-17: qty 20

## 2018-06-17 MED ORDER — OMEGA-3-ACID ETHYL ESTERS 1 G PO CAPS
1.0000 g | ORAL_CAPSULE | Freq: Every day | ORAL | Status: DC
  Administered 2018-06-17 – 2018-06-19 (×3): 1 g via ORAL
  Filled 2018-06-17 (×3): qty 1

## 2018-06-17 MED ORDER — BUPIVACAINE HCL (PF) 0.5 % IJ SOLN
INTRAMUSCULAR | Status: DC | PRN
  Administered 2018-06-17: 2.4 mL via INTRATHECAL

## 2018-06-17 MED ORDER — TRANEXAMIC ACID-NACL 1000-0.7 MG/100ML-% IV SOLN
1000.0000 mg | Freq: Once | INTRAVENOUS | Status: AC
  Administered 2018-06-17: 1000 mg via INTRAVENOUS
  Filled 2018-06-17: qty 100

## 2018-06-17 MED ORDER — BISACODYL 10 MG RE SUPP: 10.0000 mg | Freq: Every day | RECTAL | Status: DC | PRN

## 2018-06-17 MED ORDER — GABAPENTIN 300 MG PO CAPS
300.0000 mg | ORAL_CAPSULE | Freq: Every day | ORAL | Status: DC
  Administered 2018-06-17 – 2018-06-18 (×2): 300 mg via ORAL
  Filled 2018-06-17 (×2): qty 1

## 2018-06-17 MED ORDER — CHLORHEXIDINE GLUCONATE 4 % EX LIQD: 60.0000 mL | Freq: Once | CUTANEOUS | Status: DC

## 2018-06-17 MED ORDER — BUPIVACAINE HCL (PF) 0.25 % IJ SOLN
INTRAMUSCULAR | Status: DC | PRN
  Administered 2018-06-17: 60 mL

## 2018-06-17 MED ORDER — POTASSIUM CHLORIDE CRYS ER 20 MEQ PO TBCR
20.0000 meq | EXTENDED_RELEASE_TABLET | Freq: Every day | ORAL | Status: DC
  Administered 2018-06-17 – 2018-06-19 (×3): 20 meq via ORAL
  Filled 2018-06-17 (×3): qty 1

## 2018-06-17 MED ORDER — SODIUM CHLORIDE 0.9 % IV SOLN
INTRAVENOUS | Status: DC | PRN
  Administered 2018-06-17: 08:00:00 500 mL

## 2018-06-17 MED ORDER — PROPOFOL 10 MG/ML IV BOLUS
INTRAVENOUS | Status: AC
  Filled 2018-06-17: qty 20

## 2018-06-17 MED ORDER — HYDROMORPHONE HCL 1 MG/ML IJ SOLN: 0.5000 mg | INTRAMUSCULAR | Status: DC | PRN

## 2018-06-17 MED ORDER — MIDAZOLAM HCL 2 MG/2ML IJ SOLN
INTRAMUSCULAR | Status: AC
  Filled 2018-06-17: qty 2

## 2018-06-17 MED ORDER — CEFAZOLIN SODIUM-DEXTROSE 2-4 GM/100ML-% IV SOLN
INTRAVENOUS | Status: AC
  Filled 2018-06-17: qty 100

## 2018-06-17 MED ORDER — ACETAMINOPHEN 10 MG/ML IV SOLN
1000.0000 mg | Freq: Four times a day (QID) | INTRAVENOUS | Status: AC
  Administered 2018-06-17 – 2018-06-18 (×4): 1000 mg via INTRAVENOUS
  Filled 2018-06-17 (×4): qty 100

## 2018-06-17 MED ORDER — AMLODIPINE-VALSARTAN-HCTZ 10-320-25 MG PO TABS: 1.0000 | ORAL_TABLET | Freq: Every day | ORAL | Status: DC

## 2018-06-17 MED ORDER — BUPIVACAINE HCL (PF) 0.25 % IJ SOLN
INTRAMUSCULAR | Status: AC
  Filled 2018-06-17: qty 30

## 2018-06-17 MED ORDER — SODIUM CHLORIDE FLUSH 0.9 % IV SOLN
INTRAVENOUS | Status: AC
  Filled 2018-06-17: qty 40

## 2018-06-17 MED ORDER — TETRACAINE HCL 1 % IJ SOLN
INTRAMUSCULAR | Status: DC | PRN
  Administered 2018-06-17: 6 mg via INTRASPINAL

## 2018-06-17 MED ORDER — PROPOFOL 500 MG/50ML IV EMUL
INTRAVENOUS | Status: AC
  Filled 2018-06-17: qty 50

## 2018-06-17 MED ORDER — ONDANSETRON HCL 4 MG/2ML IJ SOLN: 4.0000 mg | Freq: Four times a day (QID) | INTRAMUSCULAR | Status: DC | PRN

## 2018-06-17 MED ORDER — FENTANYL CITRATE (PF) 100 MCG/2ML IJ SOLN: 25.0000 ug | INTRAMUSCULAR | Status: DC | PRN

## 2018-06-17 MED ORDER — METOCLOPRAMIDE HCL 5 MG PO TABS: 5.0000 mg | ORAL_TABLET | Freq: Three times a day (TID) | ORAL | Status: DC | PRN

## 2018-06-17 MED ORDER — OXYCODONE HCL 5 MG PO TABS: 10.0000 mg | ORAL_TABLET | ORAL | Status: DC | PRN

## 2018-06-17 MED ORDER — LIDOCAINE HCL (PF) 2 % IJ SOLN
INTRAMUSCULAR | Status: AC
  Filled 2018-06-17: qty 10

## 2018-06-17 MED ORDER — PHENOL 1.4 % MT LIQD: 1.0000 | OROMUCOSAL | Status: DC | PRN

## 2018-06-17 MED ORDER — SODIUM CHLORIDE 0.9 % IV SOLN
INTRAVENOUS | Status: DC | PRN
  Administered 2018-06-17: 60 mL

## 2018-06-17 MED ORDER — HYDROCHLOROTHIAZIDE 25 MG PO TABS
25.0000 mg | ORAL_TABLET | Freq: Every day | ORAL | Status: DC
  Administered 2018-06-17 – 2018-06-19 (×3): 25 mg via ORAL
  Filled 2018-06-17 (×3): qty 1

## 2018-06-17 MED ORDER — ACETAMINOPHEN 325 MG PO TABS
325.0000 mg | ORAL_TABLET | Freq: Four times a day (QID) | ORAL | Status: DC | PRN
  Administered 2018-06-18: 20:00:00 650 mg via ORAL
  Filled 2018-06-17: qty 2

## 2018-06-17 MED ORDER — OXYCODONE HCL 5 MG PO TABS
5.0000 mg | ORAL_TABLET | ORAL | Status: DC | PRN
  Filled 2018-06-17: qty 1

## 2018-06-17 MED ORDER — TETRACAINE HCL 1 % IJ SOLN
INTRAMUSCULAR | Status: AC
  Filled 2018-06-17: qty 2

## 2018-06-17 MED ORDER — TRAMADOL HCL 50 MG PO TABS
50.0000 mg | ORAL_TABLET | ORAL | Status: DC | PRN
  Administered 2018-06-17: 50 mg via ORAL
  Filled 2018-06-17 (×2): qty 1

## 2018-06-17 MED ORDER — ADULT MULTIVITAMIN W/MINERALS CH
1.0000 | ORAL_TABLET | Freq: Every day | ORAL | Status: DC
  Administered 2018-06-17 – 2018-06-19 (×3): 1 via ORAL
  Filled 2018-06-17 (×3): qty 1

## 2018-06-17 MED ORDER — POLYVINYL ALCOHOL 1.4 % OP SOLN: 2.0000 [drp] | Freq: Every day | OPHTHALMIC | Status: DC | PRN

## 2018-06-17 MED ORDER — BUPIVACAINE HCL (PF) 0.5 % IJ SOLN
INTRAMUSCULAR | Status: AC
  Filled 2018-06-17: qty 10

## 2018-06-17 MED ORDER — DEXAMETHASONE SODIUM PHOSPHATE 10 MG/ML IJ SOLN
INTRAMUSCULAR | Status: AC
  Filled 2018-06-17: qty 1

## 2018-06-17 MED ORDER — ALUM & MAG HYDROXIDE-SIMETH 200-200-20 MG/5ML PO SUSP: 30.0000 mL | ORAL | Status: DC | PRN

## 2018-06-17 MED ORDER — GENTAMICIN SULFATE 40 MG/ML IJ SOLN
INTRAMUSCULAR | Status: DC | PRN
  Administered 2018-06-17: 240 mg

## 2018-06-17 MED ORDER — FENTANYL CITRATE (PF) 100 MCG/2ML IJ SOLN
INTRAMUSCULAR | Status: AC
  Filled 2018-06-17: qty 2

## 2018-06-17 MED ORDER — ACETAMINOPHEN 10 MG/ML IV SOLN
INTRAVENOUS | Status: AC
  Filled 2018-06-17: qty 100

## 2018-06-17 MED ORDER — MAGNESIUM HYDROXIDE 400 MG/5ML PO SUSP
30.0000 mL | Freq: Every day | ORAL | Status: DC
  Administered 2018-06-18: 09:00:00 30 mL via ORAL
  Filled 2018-06-17 (×3): qty 30

## 2018-06-17 MED ORDER — CEFAZOLIN SODIUM 1 G IJ SOLR
INTRAMUSCULAR | Status: AC
  Filled 2018-06-17: qty 20

## 2018-06-17 MED ORDER — METOCLOPRAMIDE HCL 5 MG PO TABS
10.0000 mg | ORAL_TABLET | Freq: Three times a day (TID) | ORAL | Status: DC
  Administered 2018-06-17 – 2018-06-19 (×7): 10 mg via ORAL
  Filled 2018-06-17 (×7): qty 2

## 2018-06-17 MED ORDER — FERROUS SULFATE 325 (65 FE) MG PO TABS
325.0000 mg | ORAL_TABLET | Freq: Two times a day (BID) | ORAL | Status: DC
  Administered 2018-06-17 – 2018-06-19 (×4): 325 mg via ORAL
  Filled 2018-06-17 (×4): qty 1

## 2018-06-17 MED ORDER — VITAMIN C 500 MG PO TABS
500.0000 mg | ORAL_TABLET | Freq: Every day | ORAL | Status: DC
  Administered 2018-06-17 – 2018-06-19 (×3): 500 mg via ORAL
  Filled 2018-06-17 (×3): qty 1

## 2018-06-17 MED ORDER — METOCLOPRAMIDE HCL 5 MG/ML IJ SOLN: 5.0000 mg | Freq: Three times a day (TID) | INTRAMUSCULAR | Status: DC | PRN

## 2018-06-17 MED ORDER — AMLODIPINE BESYLATE 10 MG PO TABS
10.0000 mg | ORAL_TABLET | Freq: Every day | ORAL | Status: DC
  Administered 2018-06-17 – 2018-06-19 (×3): 10 mg via ORAL
  Filled 2018-06-17 (×3): qty 1

## 2018-06-17 MED ORDER — PHENYLEPHRINE HCL (PRESSORS) 10 MG/ML IV SOLN
INTRAVENOUS | Status: DC | PRN
  Administered 2018-06-17 (×4): 100 ug via INTRAVENOUS

## 2018-06-17 MED ORDER — CELECOXIB 200 MG PO CAPS
ORAL_CAPSULE | ORAL | Status: AC
  Filled 2018-06-17: qty 2

## 2018-06-17 MED ORDER — KETAMINE HCL 50 MG/ML IJ SOLN
INTRAMUSCULAR | Status: DC | PRN
  Administered 2018-06-17: 25 mg via INTRAVENOUS
  Administered 2018-06-17: 50 mg via INTRAVENOUS

## 2018-06-17 MED ORDER — ONDANSETRON HCL 4 MG PO TABS: 4.0000 mg | ORAL_TABLET | Freq: Four times a day (QID) | ORAL | Status: DC | PRN

## 2018-06-17 MED ORDER — PROPOFOL 500 MG/50ML IV EMUL
INTRAVENOUS | Status: DC | PRN
  Administered 2018-06-17: 50 ug/kg/min via INTRAVENOUS

## 2018-06-17 MED ORDER — GENTAMICIN SULFATE 40 MG/ML IJ SOLN
INTRAMUSCULAR | Status: AC
  Filled 2018-06-17: qty 8

## 2018-06-17 MED ORDER — ACETAMINOPHEN 10 MG/ML IV SOLN
INTRAVENOUS | Status: DC | PRN
  Administered 2018-06-17: 1000 mg via INTRAVENOUS

## 2018-06-17 MED ORDER — VITAMIN D 25 MCG (1000 UNIT) PO TABS
5000.0000 [IU] | ORAL_TABLET | Freq: Every day | ORAL | Status: DC
  Administered 2018-06-17 – 2018-06-19 (×3): 5000 [IU] via ORAL
  Filled 2018-06-17 (×3): qty 5

## 2018-06-17 MED ORDER — FLEET ENEMA 7-19 GM/118ML RE ENEM: 1.0000 | ENEMA | Freq: Once | RECTAL | Status: DC | PRN

## 2018-06-17 MED ORDER — GABAPENTIN 300 MG PO CAPS
ORAL_CAPSULE | ORAL | Status: AC
  Filled 2018-06-17: qty 1

## 2018-06-17 MED ORDER — PROPOFOL 10 MG/ML IV BOLUS
INTRAVENOUS | Status: DC | PRN
  Administered 2018-06-17 (×2): 20 mg via INTRAVENOUS

## 2018-06-17 MED ORDER — SODIUM CHLORIDE 0.9 % IV SOLN
INTRAVENOUS | Status: DC
  Administered 2018-06-17 – 2018-06-18 (×2): via INTRAVENOUS

## 2018-06-17 MED ORDER — CEFAZOLIN SODIUM-DEXTROSE 2-4 GM/100ML-% IV SOLN
2.0000 g | Freq: Four times a day (QID) | INTRAVENOUS | Status: AC
  Administered 2018-06-17 – 2018-06-18 (×4): 2 g via INTRAVENOUS
  Filled 2018-06-17 (×4): qty 100

## 2018-06-17 MED ORDER — CELECOXIB 200 MG PO CAPS
400.0000 mg | ORAL_CAPSULE | Freq: Once | ORAL | Status: AC
  Administered 2018-06-17: 400 mg via ORAL

## 2018-06-17 MED ORDER — LACTATED RINGERS IV SOLN
INTRAVENOUS | Status: DC
  Administered 2018-06-17 (×2): via INTRAVENOUS

## 2018-06-17 MED ORDER — GLYCOPYRROLATE 0.2 MG/ML IJ SOLN
INTRAMUSCULAR | Status: AC
  Filled 2018-06-17: qty 1

## 2018-06-17 MED ORDER — ENOXAPARIN SODIUM 30 MG/0.3ML ~~LOC~~ SOLN
30.0000 mg | Freq: Two times a day (BID) | SUBCUTANEOUS | Status: DC
  Administered 2018-06-18 – 2018-06-19 (×3): 30 mg via SUBCUTANEOUS
  Filled 2018-06-17 (×3): qty 0.3

## 2018-06-17 MED ORDER — SENNOSIDES-DOCUSATE SODIUM 8.6-50 MG PO TABS
1.0000 | ORAL_TABLET | Freq: Two times a day (BID) | ORAL | Status: DC
  Administered 2018-06-17 – 2018-06-19 (×4): 1 via ORAL
  Filled 2018-06-17 (×4): qty 1

## 2018-06-17 MED ORDER — FLAXSEED OIL 1000 MG PO CAPS: 1.0000 | ORAL_CAPSULE | Freq: Every day | ORAL | Status: DC

## 2018-06-17 MED ORDER — FENTANYL CITRATE (PF) 100 MCG/2ML IJ SOLN
INTRAMUSCULAR | Status: DC | PRN
  Administered 2018-06-17 (×2): 50 ug via INTRAVENOUS

## 2018-06-17 SURGICAL SUPPLY — 70 items
ATTUNE MED DOME PAT 38 KNEE (Knees) ×1 IMPLANT
ATTUNE MED DOME PAT 38MM KNEE (Knees) ×1 IMPLANT
ATTUNE PS FEM LT SZ 6 CEM KNEE (Femur) ×2 IMPLANT
ATTUNE PSRP INSR SZ6 5 KNEE (Insert) ×1 IMPLANT
ATTUNE PSRP INSR SZ6 5MM KNEE (Insert) ×1 IMPLANT
BASE TIBIA ATTUNE KNEE SYS SZ6 (Knees) IMPLANT
BATTERY INSTRU NAVIGATION (MISCELLANEOUS) ×12 IMPLANT
BLADE SAW 70X12.5 (BLADE) ×3 IMPLANT
BLADE SAW 90X13X1.19 OSCILLAT (BLADE) ×3 IMPLANT
BLADE SAW 90X25X1.19 OSCILLAT (BLADE) ×3 IMPLANT
CANISTER SUCT 3000ML PPV (MISCELLANEOUS) ×3 IMPLANT
CEMENT HV SMART SET (Cement) ×6 IMPLANT
COOLER POLAR GLACIER W/PUMP (MISCELLANEOUS) ×3 IMPLANT
COVER WAND RF STERILE (DRAPES) ×3 IMPLANT
CUFF TOURN SGL QUICK 24 (TOURNIQUET CUFF)
CUFF TOURN SGL QUICK 30 (TOURNIQUET CUFF) ×2
CUFF TRNQT CYL 24X4X16.5-23 (TOURNIQUET CUFF) IMPLANT
CUFF TRNQT CYL 30X4X21-28X (TOURNIQUET CUFF) IMPLANT
DRAPE SHEET LG 3/4 BI-LAMINATE (DRAPES) ×3 IMPLANT
DRSG DERMACEA 8X12 NADH (GAUZE/BANDAGES/DRESSINGS) ×3 IMPLANT
DRSG OPSITE POSTOP 4X14 (GAUZE/BANDAGES/DRESSINGS) ×3 IMPLANT
DRSG TEGADERM 4X4.75 (GAUZE/BANDAGES/DRESSINGS) ×3 IMPLANT
DURAPREP 26ML APPLICATOR (WOUND CARE) ×6 IMPLANT
ELECT REM PT RETURN 9FT ADLT (ELECTROSURGICAL) ×3
ELECTRODE REM PT RTRN 9FT ADLT (ELECTROSURGICAL) ×1 IMPLANT
EX-PIN ORTHOLOCK NAV 4X150 (PIN) ×6 IMPLANT
GLOVE BIOGEL M STRL SZ7.5 (GLOVE) ×6 IMPLANT
GLOVE INDICATOR 8.0 STRL GRN (GLOVE) ×3 IMPLANT
GOWN STRL REUS W/ TWL LRG LVL3 (GOWN DISPOSABLE) ×2 IMPLANT
GOWN STRL REUS W/TWL LRG LVL3 (GOWN DISPOSABLE) ×4
HEMOVAC 400CC 10FR (MISCELLANEOUS) ×3 IMPLANT
HOLDER FOLEY CATH W/STRAP (MISCELLANEOUS) ×3 IMPLANT
HOOD PEEL AWAY FLYTE STAYCOOL (MISCELLANEOUS) ×8 IMPLANT
KIT TURNOVER KIT A (KITS) ×3 IMPLANT
KNIFE SCULPS 14X20 (INSTRUMENTS) ×3 IMPLANT
LABEL OR SOLS (LABEL) ×3 IMPLANT
MANIFOLD NEPTUNE WASTE (CANNULA) ×3 IMPLANT
NDL SAFETY ECLIPSE 18X1.5 (NEEDLE) ×1 IMPLANT
NDL SPNL 20GX3.5 QUINCKE YW (NEEDLE) ×2 IMPLANT
NEEDLE HYPO 18GX1.5 SHARP (NEEDLE) ×2
NEEDLE SPNL 20GX3.5 QUINCKE YW (NEEDLE) ×6 IMPLANT
NS IRRIG 500ML POUR BTL (IV SOLUTION) ×3 IMPLANT
PACK TOTAL KNEE (MISCELLANEOUS) ×3 IMPLANT
PAD WRAPON POLAR KNEE (MISCELLANEOUS) ×1 IMPLANT
PENCIL SMOKE ULTRAEVAC 22 CON (MISCELLANEOUS) ×3 IMPLANT
PIN DRILL QUICK PACK ×3 IMPLANT
PIN FIXATION 1/8DIA X 3INL (PIN) ×9 IMPLANT
PULSAVAC PLUS IRRIG FAN TIP (DISPOSABLE) ×3
SOL .9 NS 3000ML IRR  AL (IV SOLUTION) ×2
SOL .9 NS 3000ML IRR UROMATIC (IV SOLUTION) ×1 IMPLANT
SOL PREP PVP 2OZ (MISCELLANEOUS) ×3
SOLUTION PREP PVP 2OZ (MISCELLANEOUS) ×1 IMPLANT
SPONGE DRAIN TRACH 4X4 STRL 2S (GAUZE/BANDAGES/DRESSINGS) ×3 IMPLANT
SPONGE LAP 18X18 RF (DISPOSABLE) ×2 IMPLANT
STAPLER SKIN PROX 35W (STAPLE) ×3 IMPLANT
STOCKINETTE IMPERV 14X48 (MISCELLANEOUS) IMPLANT
STRAP TIBIA SHORT (MISCELLANEOUS) ×3 IMPLANT
SUCTION FRAZIER HANDLE 10FR (MISCELLANEOUS) ×2
SUCTION TUBE FRAZIER 10FR DISP (MISCELLANEOUS) ×1 IMPLANT
SUT VIC AB 0 CT1 36 (SUTURE) ×5 IMPLANT
SUT VIC AB 1 CT1 36 (SUTURE) ×6 IMPLANT
SUT VIC AB 2-0 CT2 27 (SUTURE) ×3 IMPLANT
SYR 20CC LL (SYRINGE) ×3 IMPLANT
SYR 30ML LL (SYRINGE) ×6 IMPLANT
TIBIA ATTUNE KNEE SYS BASE SZ6 (Knees) ×3 IMPLANT
TIP FAN IRRIG PULSAVAC PLUS (DISPOSABLE) ×1 IMPLANT
TOWEL OR 17X26 4PK STRL BLUE (TOWEL DISPOSABLE) ×3 IMPLANT
TOWER CARTRIDGE SMART MIX (DISPOSABLE) ×3 IMPLANT
TRAY FOLEY MTR SLVR 16FR STAT (SET/KITS/TRAYS/PACK) ×3 IMPLANT
WRAPON POLAR PAD KNEE (MISCELLANEOUS) ×3

## 2018-06-17 NOTE — Anesthesia Post-op Follow-up Note (Signed)
Anesthesia QCDR form completed.        

## 2018-06-17 NOTE — Progress Notes (Signed)
PHARMACIST - PHYSICIAN ORDER COMMUNICATION  CONCERNING: P&T Medication Policy on Herbal Medications  DESCRIPTION:  This patient's order for:  Flaxseed Oil capsules  has been noted.  This product(s) is classified as an "herbal" or natural product. Due to a lack of definitive safety studies or FDA approval, nonstandard manufacturing practices, plus the potential risk of unknown drug-drug interactions while on inpatient medications, the Pharmacy and Therapeutics Committee does not permit the use of "herbal" or natural products of this type within [facility].   ACTION TAKEN: The pharmacy department is unable to verify this order at this time. Please reevaluate patient's clinical condition at discharge and address if the herbal or natural product(s) should be resumed at that time.

## 2018-06-17 NOTE — TOC Benefit Eligibility Note (Signed)
Transition of Care [facility]) Benefit Eligibility Note    Patient Details  Name: CUTLER SUNDAY MRN: [id number] Date of Birth: [birth year]   Medication/Dose: Lovenox 51m once daily for 14 days  Covered?: Yes   Prescription Coverage Preferred Pharmacy: [facility]  Spoke with Person/Company/Phone Number:: SJudson Rochwith [employer] at [phone] Co-Pay: $185.00 estimated copay for name brand.  Prior Approval: No  Deductible: Unmet($150 deductible on plan, rep unable to disclose how much met.)  Additional Notes: Generic Enoxaparin covered with no PA required.  Estimated copay $8.87.      HDannette BarbaraPhone Number: [phone] [phone]) [phone]/15/2020, 9:25 AM

## 2018-06-17 NOTE — Evaluation (Signed)
Physical Therapy Evaluation Patient Details Name: Darryl LenisRobert L Payne MRN: [id number] DOB: [birth year] Today's Date: 06/17/2018   History of Present Illness  58 y/o male s/p L TKA 06/17/18.  Clinical Impression  Pt did well with POD0 PT exam and was able to participate with ~15 minutes of supine exercises and ambulate ~65 ft with good safety/confidence and minimal reliance on the walker.  He tolerated ROM tasks with minimally increased pain and not only had full knee extension but was able to actively flex knee to 102 w/o assist.  Pt with some lethargy (likely from meds) but was eager to participate and pleasant t/o the effort.    Follow Up Recommendations Home health PT;Follow surgeon's recommendation for DC plan and follow-up therapies    Equipment Recommendations  Rolling walker with 5" wheels(likely will not need BSC)    Recommendations for Other Services       Precautions / Restrictions Precautions Precautions: Knee;Fall Restrictions Weight Bearing Restrictions: Yes LLE Weight Bearing: Weight bearing as tolerated      Mobility  Bed Mobility Overal bed mobility: Independent             General bed mobility comments: Pt able to get himself up to sitting with relative ease, good confidence  Transfers Overall transfer level: Independent Equipment used: Rolling walker (2 wheeled)             General transfer comment: Pt needed only minimal cuing to position L&UEs for set up, able to rise and maintain balance w/o assist  Ambulation/Gait Ambulation/Gait assistance: Supervision Gait Distance (Feet): 65 Feet Assistive device: Rolling walker (2 wheeled)       General Gait Details: Pt did well with initial attempt at ambulation, he showed very little reliance on the walker and did not have obvious limp or hesitation.  Pt did well not have any LOBs or safety issues, though he did have some mild lightheadedness during standing.  Stairs            Wheelchair  Mobility    Modified Rankin (Stroke Patients Only)       Balance Overall balance assessment: Modified Independent                                           Pertinent Vitals/Pain Pain Assessment: 0-10 Pain Score: 5  Pain Location: increases to 7/10 with ROM tasks    Home Living Family/patient expects to be discharged to:: Private residence Living Arrangements: Spouse/significant other Available Help at Discharge: Family   Home Access: Stairs to enter Entrance Stairs-Rails: None Entrance Stairs-Number of Steps: 3   Home Equipment: None      Prior Function Level of Independence: Independent         Comments: Pt works (standing most of the time), independent, active     Hand Dominance        Extremity/Trunk Assessment   Upper Extremity Assessment Upper Extremity Assessment: Overall WFL for tasks assessed    Lower Extremity Assessment Lower Extremity Assessment: Overall WFL for tasks assessed(expected post-op weakness, 10 SLRs with light resist)       Communication   Communication: No difficulties  Cognition Arousal/Alertness: Suspect due to medications;Lethargic Behavior During Therapy: WFL for tasks assessed/performed Overall Cognitive Status: Within Functional Limits for tasks assessed  General Comments      Exercises Total Joint Exercises Ankle Circles/Pumps: AROM;10 reps Quad Sets: Strengthening;10 reps Short Arc Quad: Strengthening;10 reps Heel Slides: 10 reps;Strengthening(with lightly resisted leg extensions) Hip ABduction/ADduction: Strengthening;10 reps Straight Leg Raises: Strengthening;10 reps Knee Flexion: PROM;5 reps(gentle end-range overpressure) Goniometric ROM: 0-102 (AROM)   Assessment/Plan    PT Assessment Patient needs continued PT services  PT Problem List Decreased activity tolerance;Decreased range of motion;Decreased strength;Decreased  balance;Decreased mobility;Decreased coordination;Decreased knowledge of use of DME;Decreased safety awareness;Decreased knowledge of precautions;Pain       PT Treatment Interventions DME instruction;Gait training;Stair training;Functional mobility training;Therapeutic activities;Therapeutic exercise;Balance training;Cognitive remediation;Patient/family education    PT Goals (Current goals can be found in the Care Plan section)  Acute Rehab PT Goals Patient Stated Goal: go home PT Goal Formulation: With patient Time For Goal Achievement: 07/01/18 Potential to Achieve Goals: Good    Frequency BID   Barriers to discharge        Co-evaluation               AM-PAC PT "6 Clicks" Mobility  Outcome Measure Help needed turning from your back to your side while in a flat bed without using bedrails?: None Help needed moving from lying on your back to sitting on the side of a flat bed without using bedrails?: None Help needed moving to and from a bed to a chair (including a wheelchair)?: None Help needed standing up from a chair using your arms (e.g., wheelchair or bedside chair)?: None Help needed to walk in hospital room?: A Little Help needed climbing 3-5 steps with a railing? : A Little 6 Click Score: 22    End of Session Equipment Utilized During Treatment: Gait belt Activity Tolerance: Patient tolerated treatment well Patient left: with chair alarm set;with call bell/phone within reach Nurse Communication: Mobility status PT Visit Diagnosis: Muscle weakness (generalized) (M62.81);Difficulty in walking, not elsewhere classified (R26.2);Pain Pain - Right/Left: Left Pain - part of body: Knee    Time: 2395-3202 PT Time Calculation (min) (ACUTE ONLY): 40 min   Charges:   PT Evaluation $PT Eval Low Complexity: 1 Low PT Treatments $Gait Training: 8-22 mins $Therapeutic Exercise: 8-22 mins        Malachi Pro, DPT 06/17/2018, 4:35 PM

## 2018-06-17 NOTE — OR Nursing (Signed)
POCT POTASSIUM 3.2 - Dr's Hooten and Piscitello aware of same

## 2018-06-17 NOTE — Anesthesia Procedure Notes (Signed)
Spinal  Patient location during procedure: OR Staffing Anesthesiologist: Piscitello, Joseph K, MD Resident/CRNA: Ellison Leisure, CRNA Performed: resident/CRNA  Preanesthetic Checklist Completed: patient identified, site marked, surgical consent, pre-op evaluation, timeout performed, IV checked, risks and benefits discussed and monitors and equipment checked Spinal Block Patient position: sitting Prep: ChloraPrep and site prepped and draped Patient monitoring: heart rate, continuous pulse ox, blood pressure and cardiac monitor Approach: midline Location: L4-5 Injection technique: single-shot Needle Needle type: Introducer and Pencan  Needle gauge: 24 G Needle length: 9 cm Additional Notes Negative paresthesia. Negative blood return. Positive free-flowing CSF. Expiration date of kit checked and confirmed. Patient tolerated procedure well, without complications.       

## 2018-06-17 NOTE — Op Note (Signed)
OPERATIVE NOTE  DATE OF SURGERY:  06/17/2018  PATIENT NAME:  Darryl Payne   DOB: 03-03-1960  MRN: [id number]  PRE-OPERATIVE DIAGNOSIS: Degenerative arthrosis of the left knee, primary  POST-OPERATIVE DIAGNOSIS:  Same  PROCEDURE:  Left total knee arthroplasty using computer-assisted navigation  SURGEON:  Jena Gauss. M.D.  ASSISTANT: Orma Flaming, RN (present and scrubbed throughout the case, critical for assistance with exposure, retraction, instrumentation, and closure)  ANESTHESIA: spinal  ESTIMATED BLOOD LOSS: 100 mL  FLUIDS REPLACED: 1800 mL of crystalloid  TOURNIQUET TIME: 139 minutes  DRAINS: 2 medium Hemovac drains  SOFT TISSUE RELEASES: Anterior cruciate ligament, posterior cruciate ligament, deep medial collateral ligament, patellofemoral ligament, posterolateral corner  IMPLANTS UTILIZED: DePuy Attune size 6 posterior stabilized femoral component (cemented), size 6 rotating platform tibial component (cemented), 38 mm medialized dome patella (cemented), and a 5 mm stabilized rotating platform polyethylene insert.  INDICATIONS FOR SURGERY: Darryl Payne is a 58 y.o. year old male with a long history of progressive knee pain. X-rays demonstrated severe degenerative changes in tricompartmental fashion. The patient had not seen any significant improvement despite conservative nonsurgical intervention. After discussion of the risks and benefits of surgical intervention, the patient expressed understanding of the risks benefits and agree with plans for total knee arthroplasty.   The risks, benefits, and alternatives were discussed at length including but not limited to the risks of infection, bleeding, nerve injury, stiffness, blood clots, the need for revision surgery, cardiopulmonary complications, among others, and they were willing to proceed.  PROCEDURE IN DETAIL: The patient was brought into the operating room and, after adequate spinal anesthesia was  achieved, a tourniquet was placed on the patient's upper thigh. The patient's knee and leg were cleaned and prepped with alcohol and DuraPrep and draped in the usual sterile fashion. A "timeout" was performed as per usual protocol. The lower extremity was exsanguinated using an Esmarch, and the tourniquet was inflated to 300 mmHg. An anterior longitudinal incision was made followed by a standard mid vastus approach. The deep fibers of the medial collateral ligament were elevated in a subperiosteal fashion off of the medial flare of the tibia so as to maintain a continuous soft tissue sleeve. The patella was subluxed laterally and the patellofemoral ligament was incised. Inspection of the knee demonstrated severe degenerative changes with full-thickness loss of articular cartilage. Osteophytes were debrided using a rongeur. Anterior and posterior cruciate ligaments were excised. Two 4.0 mm Schanz pins were inserted in the femur and into the tibia for attachment of the array of trackers used for computer-assisted navigation. Hip center was identified using a circumduction technique. Distal landmarks were mapped using the computer. The distal femur and proximal tibia were mapped using the computer. The distal femoral cutting guide was positioned using computer-assisted navigation so as to achieve a 5 distal valgus cut. The femur was sized and it was felt that a size 6 femoral component was appropriate. A size 6 femoral cutting guide was positioned and the anterior cut was performed and verified using the computer. This was followed by completion of the posterior and chamfer cuts. Femoral cutting guide for the central box was then positioned in the center box cut was performed.  Attention was then directed to the proximal tibia. Medial and lateral menisci were excised. The extramedullary tibial cutting guide was positioned using computer-assisted navigation so as to achieve a 0 varus-valgus alignment and 3  posterior slope. The cut was performed and verified using the computer. The proximal  tibia was sized and it was felt that a size 6 tibial tray was appropriate. Tibial and femoral trials were inserted followed by insertion of a 5 mm polyethylene insert. The knee was felt to be tight laterally.  The trial components were removed and the knee was brought into full extension and distracted using the Moreland retractors.  The posterolateral corner was carefully released using a combination of electrocautery and Metzenbaum scissors.  Trial components were reinserted followed by placement of a 5 mm polyethylene trial.  This allowed for excellent mediolateral soft tissue balancing both in flexion and in full extension. Finally, the patella was cut and prepared so as to accommodate a 38 mm medialized dome patella. A patella trial was placed and the knee was placed through a range of motion with excellent patellar tracking appreciated. The femoral trial was removed after debridement of posterior osteophytes. The central post-hole for the tibial component was reamed followed by insertion of a keel punch. Tibial trials were then removed. Cut surfaces of bone were irrigated with copious amounts of normal saline with antibiotic solution using pulsatile lavage and then suctioned dry. Polymethylmethacrylate cement was prepared in the usual fashion using a vacuum mixer. Cement was applied to the cut surface of the proximal tibia as well as along the undersurface of a size 6 rotating platform tibial component. Tibial component was positioned and impacted into place. Excess cement was removed using Personal assistantreer elevators. Cement was then applied to the cut surfaces of the femur as well as along the posterior flanges of the size 6 femoral component. The femoral component was positioned and impacted into place. Excess cement was removed using Personal assistantreer elevators. A 5 mm polyethylene trial was inserted and the knee was brought into full extension  with steady axial compression applied. Finally, cement was applied to the backside of a 38 mm medialized dome patella and the patellar component was positioned and patellar clamp applied. Excess cement was removed using Personal assistantreer elevators. After adequate curing of the cement, the tourniquet was deflated after a total tourniquet time of 139 minutes. Hemostasis was achieved using electrocautery. The knee was irrigated with copious amounts of normal saline with antibiotic solution using pulsatile lavage and then suctioned dry. 20 mL of 1.3% Exparel and 60 mL of 0.25% Marcaine in 40 mL of normal saline was injected along the posterior capsule, medial and lateral gutters, and along the arthrotomy site. A 5 mm stabilized rotating platform polyethylene insert was inserted and the knee was placed through a range of motion with excellent mediolateral soft tissue balancing appreciated and excellent patellar tracking noted. 2 medium drains were placed in the wound bed and brought out through separate stab incisions. The medial parapatellar portion of the incision was reapproximated using interrupted sutures of #1 Vicryl. Subcutaneous tissue was approximated in layers using first #0 Vicryl followed #2-0 Vicryl. The skin was approximated with skin staples. A sterile dressing was applied.  The patient tolerated the procedure well and was transported to the recovery room in stable condition.    James P. Angie FavaHooten, Jr., M.D.

## 2018-06-17 NOTE — Anesthesia Preprocedure Evaluation (Signed)
Anesthesia Evaluation  Patient identified by MRN, date of birth, ID band Patient awake    Reviewed: Allergy & Precautions, H&P , NPO status , Patient's Chart, lab work & pertinent test results  History of Anesthesia Complications Negative for: history of anesthetic complications  Airway Mallampati: II  TM Distance: >3 FB Neck ROM: full    Dental  (+) Chipped   Pulmonary neg shortness of breath, sleep apnea , former smoker,           Cardiovascular Exercise Tolerance: Good hypertension, (-) angina(-) Past MI and (-) DOE      Neuro/Psych negative neurological ROS  negative psych ROS   GI/Hepatic Neg liver ROS, GERD  Medicated and Controlled,  Endo/Other  negative endocrine ROS  Renal/GU      Musculoskeletal   Abdominal   Peds  Hematology negative hematology ROS (+)   Anesthesia Other Findings Past Medical History: No date: Arthritis No date: GERD (gastroesophageal reflux disease) No date: Hypertension No date: OA (osteoarthritis)     Comment:  Left Shoulder 04/2010: Sleep apnea     Comment:  sleep study [facility], not using CPAP               lost weight   Past Surgical History: No date: CARDIAC CATHETERIZATION     Comment:  03/19/08 [facility]): Normal coronaries,               EF 60%, no sign gradient across AV.  No date: CARDIOVASCULAR STRESS TEST 02/02/2010: COLONOSCOPY     Comment:  normal/ repeat in 10 yrs- [facility] 58 yo: FRACTURE SURGERY     Comment:  left ankle No date: HERNIA REPAIR 09/17/2011: KNEE ARTHROSCOPY     Comment:  Procedure: ARTHROSCOPY KNEE;  Surgeon: Senaida Lange,               MD;  Location: [facility] OR;  Service: Orthopedics;  Laterality:              Left;  with Medial meniscectomy and debridement No date: PROSTATE SURGERY     Comment:  laser  04/30/2016: TOTAL SHOULDER ARTHROPLASTY; Left     Comment:  Procedure: TOTAL SHOULDER ARTHROPLASTY;  Surgeon:  Jones Broom, MD;  Location: [facility] OR;  Service: Orthopedics;                Laterality: Left;  Left total shoulder arthroplasty No date: TRANSTHORACIC ECHOCARDIOGRAM     Comment:  04/24/10 [facility]): NL LV systolic               function, EF 65-70%, mild LVH, grade 1 diatolic               dysfunction, mildly dilated RV/RA with normal RV               contraction.   BMI    Body Mass Index:  33.97 kg/m      Reproductive/Obstetrics negative OB ROS                             Anesthesia Physical Anesthesia Plan  ASA: III  Anesthesia Plan: Spinal   Post-op Pain Management:    Induction:   PONV Risk Score and Plan:   Airway Management Planned: Natural Airway and Nasal Cannula  Additional Equipment:   Intra-op Plan:   Post-operative Plan:  Informed Consent: I have reviewed the patients History and Physical, chart, labs and discussed the procedure including the risks, benefits and alternatives for the proposed anesthesia with the patient or authorized representative who has indicated his/her understanding and acceptance.     Dental Advisory Given  Plan Discussed with: Anesthesiologist, CRNA and Surgeon  Anesthesia Plan Comments: (Patient reports no bleeding problems and no anticoagulant use.  Plan for spinal with backup GA  Patient consented for risks of anesthesia including but not limited to:  - adverse reactions to medications - risk of bleeding, infection, nerve damage and headache - risk of failed spinal - damage to teeth, lips or other oral mucosa - sore throat or hoarseness - Damage to heart, brain, lungs or loss of life  Patient voiced understanding.)        Anesthesia Quick Evaluation

## 2018-06-17 NOTE — Discharge Instructions (Signed)
°  Instructions after Total Knee Replacement ° ° Glenn Christo P. Stanislaw Acton, Jr., M.D.    ° Dept. of Orthopaedics & Sports Medicine ° [facility] ° [address] ° [place], [place]  [address] ° Phone: [phone]   Fax: [phone] ° °  °DIET: °• Drink plenty of non-alcoholic fluids. °• Resume your normal diet. Include foods high in fiber. ° °ACTIVITY:  °• You may use crutches or a walker with weight-bearing as tolerated, unless instructed otherwise. °• You may be weaned off of the walker or crutches by your Physical Therapist.  °• Do NOT place pillows under the knee. Anything placed under the knee could limit your ability to straighten the knee.   °• Continue doing gentle exercises. Exercising will reduce the pain and swelling, increase motion, and prevent muscle weakness.   °• Please continue to use the TED compression stockings for 6 weeks. You may remove the stockings at night, but should reapply them in the morning. °• Do not drive or operate any equipment until instructed. ° °WOUND CARE:  °• Continue to use the PolarCare or ice packs periodically to reduce pain and swelling. °• You may bathe or shower after the staples are removed at the first office visit following surgery. ° °MEDICATIONS: °• You may resume your regular medications. °• Please take the pain medication as prescribed on the medication. °• Do not take pain medication on an empty stomach. °• You have been given a prescription for a blood thinner (Lovenox or Coumadin). Please take the medication as instructed. (NOTE: After completing a 2 week course of Lovenox, take one Enteric-coated aspirin once a day. This along with elevation will help reduce the possibility of phlebitis in your operated leg.) °• Do not drive or drink alcoholic beverages when taking pain medications. ° °CALL THE OFFICE FOR: °• Temperature above 101 degrees °• Excessive bleeding or drainage on the dressing. °• Excessive swelling, coldness, or paleness of the toes. °• Persistent  nausea and vomiting. ° °FOLLOW-UP:  °• You should have an appointment to return to the office in 10-14 days after surgery. °• Arrangements have been made for continuation of Physical Therapy (either home therapy or outpatient therapy). °  °

## 2018-06-17 NOTE — OR Nursing (Signed)
Lab tech in for draw @ [phone]

## 2018-06-17 NOTE — Transfer of Care (Signed)
Immediate Anesthesia Transfer of Care Note  Patient: Darryl Payne  Procedure(s) Performed: COMPUTER ASSISTED TOTAL KNEE ARTHROPLASTY LEFT - SLEEP APNEA (Left Knee)  Patient Location: PACU  Anesthesia Type:Spinal  Level of Consciousness: awake  Airway & Oxygen Therapy: Patient Spontanous Breathing  Post-op Assessment: Report given to RN and Post -op Vital signs reviewed and stable  Post vital signs: Reviewed  Last Vitals:  Vitals Value Taken Time  BP 120/77 06/17/2018 12:02 PM  Temp    Pulse 90 06/17/2018 12:02 PM  Resp 14 06/17/2018 12:02 PM  SpO2 99 % 06/17/2018 12:02 PM    Last Pain:  Vitals:   06/17/18 0617  TempSrc: Oral  PainSc: 0-No pain         Complications: No apparent anesthesia complications

## 2018-06-17 NOTE — H&P (Signed)
The patient has been re-examined, and the chart reviewed, and there have been no interval changes to the documented history and physical.    The risks, benefits, and alternatives have been discussed at length. The patient expressed understanding of the risks benefits and agreed with plans for surgical intervention.  James P. Hooten, Jr. M.D.    

## 2018-06-17 NOTE — TOC Initial Note (Signed)
Transition of Care [facility]) - Initial/Assessment Note    Patient Details  Name: Darryl Payne MRN: [id number] Date of Birth: 1960/07/06  Transition of Care [facility]) CM/SW Contact:    Su Hilt, RN Phone Number: 06/17/2018, 3:48 PM  Clinical Narrative:                 Met with the patient to discuss DC plan and needs He will need a RW [facility] is out of service area, gave the patient the [facility] list per Medicare guidelines,  Malachy Mood with [facility] has accepted the patient but there is a 50% copay with his insurance, notified the patient Lovenox price is $8.87, patient notified of the cost and agrees Lives at home with his wife Sees Dr. Alroy Dust PCP [facility] and can [facility], he still works but has a job on his feet all day,  He still drives but the wife provides transportation when he is not able to drive such as now   Expected Discharge Plan: [place] Barriers to Discharge: Continued Medical Work up   Patient Goals and CMS Choice Patient states their goals for this hospitalization and ongoing recovery are:: go home CMS Medicare.gov Compare Post Acute Care list provided to:: Patient Choice offered to / list presented to : Patient  Expected Discharge Plan and Services Expected Discharge Plan: [place]   Discharge Planning Services: CM Consult Post Acute Care Choice: [place] arrangements for the past 2 months: Single Family Home                 DME Arranged: Walker rolling DME Agency: [employer] Date DME Agency Contacted: 06/17/18 Time DME Agency Contacted: [phone] Representative spoke with at DME Agency: [place]: PT [place]: [place] Date [place]: 06/17/18 Time [place]: [place] Representative spoke with at [place]: Malachy Mood  Prior Living Arrangements/Services Living arrangements for the past 2 months: [place] Lives with:: Spouse Patient  language and need for interpreter reviewed:: No Do you feel safe going back to the place where you live?: Yes      Need for Family Participation in Patient Care: No (Comment) Care giver support system in place?: Yes (comment)   Criminal Activity/Legal Involvement Pertinent to Current Situation/Hospitalization: No - Comment as needed  Activities of Daily Living      Permission Sought/Granted Permission sought to share information with : Case Manager                Emotional Assessment Appearance:: Appears stated age Attitude/Demeanor/Rapport: Engaged Affect (typically observed): Accepting Orientation: : Oriented to Self, Oriented to Place, Oriented to  Time, Oriented to Situation Alcohol / Substance Use: Never Used Psych Involvement: No (comment)  Admission diagnosis:  PRIMARY OSTEOARTHRITIS OF LEFT KNEE Patient Active Problem List   Diagnosis Date Noted  . Total knee replacement status 06/17/2018  . Bilateral primary osteoarthritis of knee 02/10/2018  . Primary osteoarthritis of left knee 02/10/2018  . Chronic pain syndrome 02/10/2018  . Obesity (BMI 35.0-39.9 without comorbidity) 08/04/2016  . Primary osteoarthritis of right knee 08/04/2016  . S/P shoulder replacement, left 04/30/2016  . Glenohumeral arthritis, right 06/27/2014   PCP:  Alroy Dust, L.Marlou Sa, MD [facility]   [id number] [place] [place]Phone: [phone]: [phone]    Social Determinants of Health (SDOH) Interventions    Readmission Risk  Interventions No flowsheet data found.

## 2018-06-18 NOTE — Progress Notes (Signed)
Physical Therapy Treatment Patient Details Name: Darryl Payne MRN: [id number] DOB: 1960-04-26 Today's Date: 06/18/2018    History of Present Illness 58 y/o male s/p L TKA 06/17/18.    PT Comments    Patient is progressing very well; provided written HEP for LE exercise to improve ROM and strength; patient verbalized and demonstrates understanding. Patient is progressing in gait training ambulating 320 feet this afternoon after walking >400 feet this morning. He reports minimal knee discomfort during ambulation exhibiting good step through gait pattern. Patient also exhibits good RW control and safety awareness especially when negotiating narrow spaces. He would benefit from additional skilled PT intervention to improve strength and mobility; current plan remains appropriate; Pt left in bed with needs in reach;    Follow Up Recommendations  Home health PT;Follow surgeon's recommendation for DC plan and follow-up therapies     Equipment Recommendations  Rolling walker with 5" wheels(likely will not need BSC)    Recommendations for Other Services       Precautions / Restrictions Precautions Precautions: Knee;Fall Restrictions Weight Bearing Restrictions: Yes LLE Weight Bearing: Weight bearing as tolerated    Mobility  Bed Mobility Overal bed mobility: Independent             General bed mobility comments: pt able to transition sit to supine independently with good hand placement and safety awareness.   Transfers Overall transfer level: Modified independent Equipment used: Rolling walker (2 wheeled) Transfers: Sit to/from Stand Sit to Stand: Modified independent (Device/Increase time)         General transfer comment: mod I for sit<>Stand transfer from chair/bed, exhibits good safety awareness;   Ambulation/Gait Ambulation/Gait assistance: Supervision Gait Distance (Feet): 325 Feet Assistive device: Rolling walker (2 wheeled) Gait Pattern/deviations:  Step-through pattern     General Gait Details: Patient exhibits good safety awareness with good gait speed; slight antalgic gait with slightly decreased stance time on left and slightly shorter step length right, but this is minimal;    Stairs             Wheelchair Mobility    Modified Rankin (Stroke Patients Only)       Balance Overall balance assessment: Modified Independent                                          Cognition Arousal/Alertness: Awake/alert Behavior During Therapy: WFL for tasks assessed/performed Overall Cognitive Status: Within Functional Limits for tasks assessed                                        Exercises Total Joint Exercises Long Arc Quad: AROM;Left;10 reps;Seated Knee Flexion: AROM;Left;10 reps;Seated Other Exercises Other Exercises: provided written HEP for total knee replacement; patient verbalized understanding in supine exercise and has already been introduced to supine exercise; initiated sitting LAQ and knee flexion stretch to LLE to facilitate better ROM x10 reps each; min VCs for correct positioning including to avoid lifting hip up during knee flexion for better stretch to knee flexion;  Other Exercises: Pt educated in falls prevention strategies, compression stocking mgt, polar care mgt, and safe use of AE for LB ADL/mobility. Other Exercises: Pt assisted to room bathroom on this date. Demonstrated modified independence during toileting. Supervision provided for safety and assistance given for mgt of lines/leads.  General Comments        Pertinent Vitals/Pain Pain Assessment: No/denies pain Pain Score: 2  Pain Location: reports no pain at rest Pain Descriptors / Indicators: Discomfort;Operative site guarding Pain Intervention(s): Limited activity within patient's tolerance;Monitored during session;Repositioned;Ice applied    Home Living Family/patient expects to be discharged to:: Private  residence Living Arrangements: Spouse/significant other Available Help at Discharge: Family Type of Home: House Home Access: Stairs to enter Entrance Stairs-Rails: None   Home Equipment: None      Prior Function Level of Independence: Independent      Comments: Pt works (standing most of the time), independent, active   PT Goals (current goals can now be found in the care plan section) Acute Rehab PT Goals Patient Stated Goal: go home PT Goal Formulation: With patient Time For Goal Achievement: 07/01/18 Potential to Achieve Goals: Good Progress towards PT goals: Progressing toward goals    Frequency    BID      PT Plan Current plan remains appropriate    Co-evaluation              AM-PAC PT "6 Clicks" Mobility   Outcome Measure  Help needed turning from your back to your side while in a flat bed without using bedrails?: None Help needed moving from lying on your back to sitting on the side of a flat bed without using bedrails?: None Help needed moving to and from a bed to a chair (including a wheelchair)?: None Help needed standing up from a chair using your arms (e.g., wheelchair or bedside chair)?: None Help needed to walk in hospital room?: None Help needed climbing 3-5 steps with a railing? : A Little 6 Click Score: 23    End of Session Equipment Utilized During Treatment: Gait belt Activity Tolerance: Patient tolerated treatment well Patient left: with call bell/phone within reach;in bed;with bed alarm set Nurse Communication: Mobility status PT Visit Diagnosis: Muscle weakness (generalized) (M62.81);Difficulty in walking, not elsewhere classified (R26.2);Pain Pain - Right/Left: Left Pain - part of body: Knee     Time: 1350-1415 PT Time Calculation (min) (ACUTE ONLY): 25 min  Charges:  $Gait Training: 8-22 mins $Therapeutic Exercise: 8-22 mins                        Trotter,Margaret PT, DPT 06/18/2018, 2:29 PM

## 2018-06-18 NOTE — Evaluation (Addendum)
Occupational Therapy Evaluation Patient Details Name: Darryl Payne MRN: [id number] DOB: 1960-03-20 Today's Date: 06/18/2018    History of Present Illness 58 y/o male s/p L TKA 06/17/18.   Clinical Impression   Mr. Krager was seen for OT evaluation this date, POD#1 from above surgery. Pt was independent in all ADLs prior to surgery. Pt is eager to return to PLOF with less pain and improved safety and independence. Pt currently requires minimal assist for LB dressing while in seated position due to pain and limited AROM of L knee. Pt was able to ambulate using RW with supervision assist to use room commode during evaluation. Pt required supervision for safety and minor assistance to manage IV pole, but otherwise demonstrated near baseline level of function for toileting. Pt instructed in polar care mgt, falls prevention strategies, home/routines modifications, DME/AE for LB bathing and dressing tasks, and compression stocking mgt. Pt would benefit from skilled OT services including additional instruction in dressing techniques with or without assistive devices for dressing and bathing skills to support recall and carryover prior to discharge and ultimately to maximize safety, independence, and minimize falls risk and caregiver burden. Do not currently anticipate any OT needs following this hospitalization.       Follow Up Recommendations  No OT follow up    Equipment Recommendations  (TBD)    Recommendations for Other Services       Precautions / Restrictions Precautions Precautions: Knee;Fall Restrictions Weight Bearing Restrictions: Yes LLE Weight Bearing: Weight bearing as tolerated      Mobility Bed Mobility Overal bed mobility: Independent             General bed mobility comments: Pt able to get himself up to sitting with relative ease, good confidence  Transfers Overall transfer level: Needs assistance Equipment used: Rolling walker (2 wheeled) Transfers: Sit  to/from Stand Sit to Stand: Supervision         General transfer comment: Pt independent with sts transfers from room recliner on this date. Required min VC's for hand placement when standing with RW. Ambulated in room with supervision and assist for mgt lines/leads.     Balance Overall balance assessment: Modified Independent                                         ADL either performed or assessed with clinical judgement   ADL Overall ADL's : Needs assistance/impaired Eating/Feeding: Independent   Grooming: Wash/dry hands;Standing;Supervision/safety   Upper Body Bathing: Supervision/ safety;Standing   Lower Body Bathing: Minimal assistance;Sit to/from stand;With adaptive equipment   Upper Body Dressing : Supervision/safety;Sitting   Lower Body Dressing: Sit to/from stand;Minimal assistance;With adaptive equipment   Toilet Transfer: RW;Set up;Supervision/safety;Ambulation Toilet Transfer Details (indicate cue type and reason): Pt able to independently ambulate from room recliner to room bathroom using RW. OT provided assistance for mgt of IV pole and hemovac. Pt stood at commode to urinate independently.  Toileting- Clothing Manipulation and Hygiene: Sit to/from stand;Supervision/safety       Functional mobility during ADLs: Supervision/safety General ADL Comments: VCs provided throughout ADL tasks/functional mobility for technique. Otherwise pt doing very well with mgt of ADLs.      Vision Baseline Vision/History: Wears glasses Wears Glasses: Reading only Patient Visual Report: No change from baseline       Perception     Praxis      Pertinent Vitals/Pain Pain Assessment:  0-10 Pain Score: 2  Pain Location: reports no pain at rest;  Pain Descriptors / Indicators: Discomfort;Operative site guarding Pain Intervention(s): Limited activity within patient's tolerance;Monitored during session;Repositioned;Ice applied     Hand Dominance Right    Extremity/Trunk Assessment Upper Extremity Assessment Upper Extremity Assessment: Overall WFL for tasks assessed   Lower Extremity Assessment Lower Extremity Assessment: Overall WFL for tasks assessed;Defer to PT evaluation   Cervical / Trunk Assessment Cervical / Trunk Assessment: Normal   Communication Communication Communication: No difficulties   Cognition Arousal/Alertness: Awake/alert Behavior During Therapy: WFL for tasks assessed/performed Overall Cognitive Status: Within Functional Limits for tasks assessed                                     General Comments       Exercises Total Joint Exercises Ankle Circles/Pumps: AROM;10 reps;Left;Supine Short Arc Quad: Strengthening;Left;10 reps;Supine Heel Slides: AROM;Left;15 reps;Supine Straight Leg Raises: Strengthening;Left;Supine;15 reps Goniometric ROM: 0-97 Other Exercises Other Exercises: required min VCS to slow down LE movement for better ROM and strengthening; able to exhibit good quad control with SLR on LLE indicating good strength and motor control; exercise x10 min;  Other Exercises: Pt educated in falls prevention strategies, compression stocking mgt, polar care mgt, and safe use of AE for LB ADL/mobility. Other Exercises: Pt assisted to room bathroom on this date. Demonstrated modified independence during toileting. Supervision provided for safety and assistance given for mgt of lines/leads.    Shoulder Instructions      Home Living Family/patient expects to be discharged to:: Private residence Living Arrangements: Spouse/significant other Available Help at Discharge: Family Type of Home: House Home Access: Stairs to enter Secretary/administrator of Steps: 3 Entrance Stairs-Rails: None       Bathroom Shower/Tub: Chief Strategy Officer: Standard Bathroom Accessibility: No   Home Equipment: None          Prior Functioning/Environment Level of Independence: Independent         Comments: Pt works (standing most of the time), independent, active        OT Problem List: Decreased range of motion;Decreased knowledge of precautions;Pain;Decreased safety awareness;Decreased knowledge of use of DME or AE;Decreased coordination      OT Treatment/Interventions: Self-care/ADL training;Therapeutic exercise;Modalities;Balance training;Energy conservation;Therapeutic activities;DME and/or AE instruction;Patient/family education    OT Goals(Current goals can be found in the care plan section) Acute Rehab OT Goals Patient Stated Goal: go home OT Goal Formulation: With patient Time For Goal Achievement: 07/02/18 Potential to Achieve Goals: Good ADL Goals Pt Will Perform Lower Body Bathing: with set-up;with supervision;sit to/from stand;with adaptive equipment(With LRAD for safety and improved functional independence.) Pt Will Perform Lower Body Dressing: with adaptive equipment;sit to/from stand;with supervision(With LRAD for safety and improved functional independence.) Additional ADL Goal #1: PT will independently verbalize a plan to implement at least 3 learned falls prevention strateigies into his daily routines/home environment for improved safety upon hospital DC.  OT Frequency: Min 1X/week   Barriers to D/C:            Co-evaluation              AM-PAC OT "6 Clicks" Daily Activity     Outcome Measure Help from another person eating meals?: None Help from another person taking care of personal grooming?: None Help from another person toileting, which includes using toliet, bedpan, or urinal?: A Little Help from another person bathing (  including washing, rinsing, drying)?: A Little Help from another person to put on and taking off regular upper body clothing?: None Help from another person to put on and taking off regular lower body clothing?: A Little 6 Click Score: 21   End of Session Equipment Utilized During Treatment: Gait belt;Rolling  walker  Activity Tolerance: Patient tolerated treatment well;No increased pain Patient left: in chair;with call bell/phone within reach;with chair alarm set;with SCD's reapplied(With polar care in place. )  OT Visit Diagnosis: Other abnormalities of gait and mobility (R26.89);Pain Pain - Right/Left: Left Pain - part of body: Knee                Time: [id number] OT Time Calculation (min): 35 min Charges:  OT General Charges $OT Visit: 1 Visit OT Evaluation $OT Eval Low Complexity: 1 Low OT Treatments $Self Care/Home Management : 23-37 mins  Rockney GheeSerenity Aviah Sorci, M.S., OTR/L Ascom: [phone] 06/18/18, 11:46 AM

## 2018-06-18 NOTE — Plan of Care (Signed)
  Problem: Activity: Goal: Ability to avoid complications of mobility impairment will improve Outcome: Progressing Goal: Range of joint motion will improve Outcome: Progressing   Problem: Clinical Measurements: Goal: Postoperative complications will be avoided or minimized Outcome: Progressing   Problem: Pain Management: Goal: Pain level will decrease with appropriate interventions Outcome: Progressing   Problem: Skin Integrity: Goal: Will show signs of wound healing Outcome: Progressing   Problem: Education: Goal: Knowledge of the prescribed therapeutic regimen will improve Outcome: Completed/Met

## 2018-06-18 NOTE — Anesthesia Postprocedure Evaluation (Signed)
Anesthesia Post Note  Patient: Darryl Payne  Procedure(s) Performed: COMPUTER ASSISTED TOTAL KNEE ARTHROPLASTY LEFT - SLEEP APNEA (Left Knee)  Patient location during evaluation: Other (floor room) Anesthesia Type: Spinal Level of consciousness: awake and alert Pain management: pain level controlled Respiratory status: spontaneous breathing and nonlabored ventilation Cardiovascular status: stable Postop Assessment: no apparent nausea or vomiting Anesthetic complications: no     Last Vitals:  Vitals:   06/18/18 0421 06/18/18 0755  BP: 114/77 113/84  Pulse: 69 68  Resp: 18 20  Temp: 36.7 C 36.6 C  SpO2: 97% 99%    Last Pain:  Vitals:   06/18/18 0755  TempSrc: Oral  PainSc:                  Jovita Gamma

## 2018-06-18 NOTE — Progress Notes (Signed)
  Subjective: 1 Day Post-Op Procedure(s) (LRB): COMPUTER ASSISTED TOTAL KNEE ARTHROPLASTY LEFT - SLEEP APNEA (Left) Darryl Payne reports pain as mild.   Darryl Payne seen in rounds with Dr. Allena Katz. Darryl Payne is well, and has had no acute complaints or problems Plan is to go Home after hospital stay. Negative for chest pain and shortness of breath Fever: no Gastrointestinal: Negative for nausea and vomiting  Objective: Vital signs in last 24 hours: Temp:  [97.1 F (36.2 C)-98.4 F (36.9 C)] 98.1 F (36.7 C) (05/16 0421) Pulse Rate:  [69-90] 69 (05/16 0421) Resp:  [13-20] 18 (05/16 0421) BP: (103-138)/(68-89) 114/77 (05/16 0421) SpO2:  [96 %-99 %] 97 % (05/16 0421)  Intake/Output from previous day:  Intake/Output Summary (Last 24 hours) at 06/18/2018 0703 Last data filed at 06/18/2018 0300 Gross per 24 hour  Intake 3534.5 ml  Output 2560 ml  Net 974.5 ml    Intake/Output this shift: No intake/output data recorded.  Labs: No results for input(s): HGB in the last 72 hours. No results for input(s): WBC, RBC, HCT, PLT in the last 72 hours. No results for input(s): NA, K, CL, CO2, BUN, CREATININE, GLUCOSE, CALCIUM in the last 72 hours. No results for input(s): LABPT, INR in the last 72 hours.   EXAM General - Darryl Payne is Alert and Oriented Extremity - Neurovascular intact Sensation intact distally Dorsiflexion/Plantar flexion intact Compartment soft Dressing/Incision - clean, dry, with Hemovac intact. Motor Function - intact, moving foot and toes well on exam.  Able to do a straight leg raise independently  Past Medical History:  Diagnosis Date  . Arthritis   . GERD (gastroesophageal reflux disease)   . Hypertension   . OA (osteoarthritis)    Left Shoulder  . Sleep apnea 04/2010   sleep study [facility], not using CPAP lost weight     Assessment/Plan: 1 Day Post-Op Procedure(s) (LRB): COMPUTER ASSISTED TOTAL KNEE ARTHROPLASTY LEFT - SLEEP APNEA (Left) Active  Problems:   Total knee replacement status  Estimated body mass index is 33.97 kg/m as calculated from the following:   Height as of this encounter: 5\' 9"  (1.753 m).   Weight as of this encounter: 104.3 kg. Advance diet Up with therapy D/C IV fluids Discharge home with home health plan for tomorrow  DVT Prophylaxis - Lovenox, Foot Pumps and TED hose Weight-Bearing as tolerated to left leg  Dedra Skeens, PA-C Orthopaedic Surgery 06/18/2018, 7:03 AM

## 2018-06-18 NOTE — Progress Notes (Signed)
Physical Therapy Treatment Patient Details Name: Darryl Payne MRN: [id number] DOB: 13-Jun-1960 Today's Date: 06/18/2018    History of Present Illness 58 y/o male s/p L TKA 06/17/18.    PT Comments    Patient is progressing well. He was able to tolerate increased gait training today with increased distance; also progressed to stair training using RW. Patient able to negotiate 4 steps without rail using RW with supervision. He was able to exhibit good walker control and sequencing following instruction; Patient continues to tolerate LE exercise well exhibiting good knee ROM and flexibility; He reports minimal pain with activity; patient would benefit from additional skilled PT Intervention to further progress strength and ROM and improve overall mobility; Current plan remains appropriate; Patient left in chair with chair alarm on and needs in reach;    Follow Up Recommendations  Home health PT;Follow surgeon's recommendation for DC plan and follow-up therapies     Equipment Recommendations  Rolling walker with 5" wheels(likely will not need BSC)    Recommendations for Other Services       Precautions / Restrictions Precautions Precautions: Knee;Fall Restrictions Weight Bearing Restrictions: Yes LLE Weight Bearing: Weight bearing as tolerated    Mobility  Bed Mobility Overal bed mobility: Independent             General bed mobility comments: Pt able to get himself up to sitting with relative ease, good confidence  Transfers Overall transfer level: Needs assistance Equipment used: Rolling walker (2 wheeled) Transfers: Sit to/from Stand Sit to Stand: Supervision         General transfer comment: Pt independent in chair and bed sit<>stand transfers; required supervision with low toilet transfer using grab bar with min VCs for hand placement and to advance LLE forward to reduce excessive knee flexion for better tolerance and comfort x1 rep;    Ambulation/Gait Ambulation/Gait assistance: Supervision Gait Distance (Feet): 440 Feet Assistive device: Rolling walker (2 wheeled)       General Gait Details: pt ambulated from room to rehab gym and back with RW, step through gait pattern, good step length, minimal antalgic gait with good knee control in stance, however slightly decreased LLE knee flexion during swing, minimal fatigue and pain reported, good gait speed, good foot clearance;    Stairs Stairs: Yes Stairs assistance: Min guard;Supervision Stair Management: No rails Number of Stairs: 4 General stair comments: Pt negotiated 4 steps x2 sets, ascending backward, descending forward with step to pattern; required min VCs for walker placement and proper sequencing for comfort and safety; exhibits good walker control following instruction and good safety awareness;    Wheelchair Mobility    Modified Rankin (Stroke Patients Only)       Balance Overall balance assessment: Modified Independent                                          Cognition Arousal/Alertness: Suspect due to medications;Lethargic Behavior During Therapy: WFL for tasks assessed/performed Overall Cognitive Status: Within Functional Limits for tasks assessed                                        Exercises Total Joint Exercises Ankle Circles/Pumps: AROM;10 reps;Left;Supine Short Arc Quad: Strengthening;Left;10 reps;Supine Heel Slides: AROM;Left;15 reps;Supine Straight Leg Raises: Strengthening;Left;Supine;15 reps Goniometric ROM: 0-97 Other Exercises  Other Exercises: required min VCS to slow down LE movement for better ROM and strengthening; able to exhibit good quad control with SLR on LLE indicating good strength and motor control; exercise x10 min;     General Comments        Pertinent Vitals/Pain Pain Assessment: No/denies pain Pain Score: 0-No pain Pain Location: reports no pain at rest;     Home  Living                      Prior Function            PT Goals (current goals can now be found in the care plan section) Acute Rehab PT Goals Patient Stated Goal: go home PT Goal Formulation: With patient Time For Goal Achievement: 07/01/18 Potential to Achieve Goals: Good Progress towards PT goals: Progressing toward goals    Frequency    BID      PT Plan Current plan remains appropriate    Co-evaluation              AM-PAC PT "6 Clicks" Mobility   Outcome Measure  Help needed turning from your back to your side while in a flat bed without using bedrails?: None Help needed moving from lying on your back to sitting on the side of a flat bed without using bedrails?: None Help needed moving to and from a bed to a chair (including a wheelchair)?: None Help needed standing up from a chair using your arms (e.g., wheelchair or bedside chair)?: None Help needed to walk in hospital room?: None Help needed climbing 3-5 steps with a railing? : A Little 6 Click Score: 23    End of Session Equipment Utilized During Treatment: Gait belt Activity Tolerance: Patient tolerated treatment well Patient left: with chair alarm set;with call bell/phone within reach;in chair Nurse Communication: Mobility status PT Visit Diagnosis: Muscle weakness (generalized) (M62.81);Difficulty in walking, not elsewhere classified (R26.2);Pain Pain - Right/Left: Left Pain - part of body: Knee     Time: 1275-1700 PT Time Calculation (min) (ACUTE ONLY): 45 min  Charges:  $Gait Training: 8-22 mins $Therapeutic Exercise: 8-22 mins $Therapeutic Activity: 8-22 mins                        Trotter,Margaret PT, DPT 06/18/2018, 9:51 AM

## 2018-06-19 MED ORDER — TRAMADOL HCL 50 MG PO TABS: 50.0000 mg | ORAL_TABLET | ORAL | 1 refills | Status: DC | PRN

## 2018-06-19 MED ORDER — OXYCODONE HCL 5 MG PO TABS: 5.0000 mg | ORAL_TABLET | ORAL | 0 refills | Status: DC | PRN

## 2018-06-19 MED ORDER — CELECOXIB 200 MG PO CAPS: 200.0000 mg | ORAL_CAPSULE | Freq: Two times a day (BID) | ORAL | 1 refills | Status: AC

## 2018-06-19 MED ORDER — ENOXAPARIN SODIUM 40 MG/0.4ML ~~LOC~~ SOLN: 40.0000 mg | SUBCUTANEOUS | 0 refills | Status: DC

## 2018-06-19 MED ORDER — ACETAMINOPHEN 325 MG PO TABS: 325.0000 mg | ORAL_TABLET | Freq: Four times a day (QID) | ORAL | Status: DC | PRN

## 2018-06-19 NOTE — Progress Notes (Signed)
Patient d/c home, wife to come pick up patient. IV removed. Dressing changed on left knee. Instructions and prescriptions given to patient, verbalized understanding.

## 2018-06-19 NOTE — TOC Transition Note (Signed)
Transition of Care [facility]) - CM/SW Discharge Note   Patient Details  Name: Darryl Payne MRN: [id number] Date of Birth: Jun 03, 1960  [facility]) CM/SW Contact:  Virgel Manifold, RN Phone Number: 06/19/2018, 8:26 AM   Clinical Narrative:   Patient to be discharged per MD order. Orders in place for home health services. Patient was previously worked up for services with [facility]. Notified Elnita Maxwell of pending discharge today. Notified Brad from [employer] about need for rolling walker. Rolling walker delivered to bedside. Patient has no other DME needs. Spouse to transport home.      Final next level of care: Home w Home Health Services Barriers to Discharge: No Barriers Identified   Patient Goals and CMS Choice Patient states their goals for this hospitalization and ongoing recovery are:: go home CMS Medicare.gov Compare Post Acute Care list provided to:: Patient Choice offered to / list presented to : Patient  Discharge Placement                       Discharge Plan and Services   Discharge Planning Services: CM Consult Post Acute Care Choice: Durable Medical Equipment, Home Health          DME Arranged: Walker rolling DME Agency: [employer] Date DME Agency Contacted: 06/19/18 Time DME Agency Contacted: [phone] Representative spoke with at DME Agency: brad HH Arranged: PT HH Agency: [facility] Date [facility] Agency Contacted: 06/19/18 Time HH Agency Contacted: [phone] Representative spoke with at [facility] Agency: cheryl  Social Determinants of Health (SDOH) Interventions     Readmission Risk Interventions Readmission Risk Prevention Plan 06/19/2018  Post Dischage Appt Complete  Medication Screening Complete  Transportation Screening Complete  Some recent data might be hidden

## 2018-06-19 NOTE — Discharge Summary (Signed)
Physician Discharge Summary  Subjective: 2 Days Post-Op Procedure(s) (LRB): COMPUTER ASSISTED TOTAL KNEE ARTHROPLASTY LEFT - SLEEP APNEA (Left) Patient reports pain as mild.   Patient seen in rounds with Dr. Allena Katz. Patient is well, and has had no acute complaints or problems Patient is ready to go home with home health physical therapy  Physician Discharge Summary  Patient ID: Darryl Payne MRN: [id number] DOB/AGE: Nov 19, 1960 58 y.o.  Admit date: 06/17/2018 Discharge date: 06/19/2018  Admission Diagnoses:  Discharge Diagnoses:  Active Problems:   Total knee replacement status   Discharged Condition: good  Hospital Course: The patient is postop day 2 from a left total knee replacement.  He is doing very well since surgery.  He has ambulated 375 feet with physical therapy and is able to do a straight leg raise independently.  He is stable with transfers.  The patient has had a bowel movement.  The patient has stable labs and vitals.  The patient had his Hemovac removed without complication today.  Treatments: surgery:   Left total knee arthroplasty using computer-assisted navigation  SURGEON:  Jena Gauss. M.D.  ASSISTANT: Orma Flaming, RN (present and scrubbed throughout the case, critical for assistance with exposure, retraction, instrumentation, and closure)  ANESTHESIA: spinal  ESTIMATED BLOOD LOSS: 100 mL  FLUIDS REPLACED: 1800 mL of crystalloid  TOURNIQUET TIME: 139 minutes  DRAINS: 2 medium Hemovac drains  SOFT TISSUE RELEASES: Anterior cruciate ligament, posterior cruciate ligament, deep medial collateral ligament, patellofemoral ligament, posterolateral corner  IMPLANTS UTILIZED: DePuy Attune size 6 posterior stabilized femoral component (cemented), size 6 rotating platform tibial component (cemented), 38 mm medialized dome patella (cemented), and a 5 mm stabilized rotating platform polyethylene insert.  Discharge Exam: Blood pressure 120/70,  pulse 62, temperature 98 F (36.7 C), temperature source Oral, resp. rate 20, height  (1.753 m), weight 104.3 kg, SpO2 97 %.   Disposition: Discharge disposition: 01-Home or Self Care        Allergies as of 06/19/2018      Reactions   Oxycodone Itching      Medication List    STOP taking these medications   ibuprofen 800 MG tablet Commonly known as:  ADVIL   naproxen sodium 220 MG tablet Commonly known as:  ALEVE     TAKE these medications   acetaminophen 325 MG tablet Commonly known as:  TYLENOL Take 1-2 tablets (325-650 mg total) by mouth every 6 (six) hours as needed for mild pain (pain score 1-3 or temp > 100.5).   AIRBORNE PO Take 1 tablet by mouth daily.   amLODIPine-Valsartan-HCTZ 10-320-25 MG Tabs Take 1 tablet by mouth daily. What changed:  when to take this   celecoxib 200 MG capsule Commonly known as:  CELEBREX Take 1 capsule (200 mg total) by mouth 2 (two) times daily.   enoxaparin 40 MG/0.4ML injection Commonly known as:  LOVENOX Inject 0.4 mLs (40 mg total) into the skin daily.   fish oil-omega-3 fatty acids 1000 MG capsule Take 1 g by mouth daily.   FLAXSEED OIL PO Take 1 capsule by mouth daily.   GINKGO BILOBA PO Take 1 capsule by mouth daily.   LUBRICANT EYE DROPS OP Place 1 drop into both eyes daily as needed (for dry eyes).   multivitamin with minerals Tabs tablet Take 1 tablet by mouth daily.   oxyCODONE 5 MG immediate release tablet Commonly known as:  Oxy IR/ROXICODONE Take 1 tablet (5 mg total) by mouth every 4 (four) hours as  needed for moderate pain (pain score 4-6).   pantoprazole 40 MG tablet Commonly known as:  PROTONIX Take 1 tablet (40 mg total) by mouth daily. What changed:  when to take this   Potassium Chloride ER 20 MEQ Tbcr Take 1 tablet by mouth daily.   sildenafil 20 MG tablet Commonly known as:  REVATIO Take 60 mg by mouth daily as needed (for erectile dysfunction.).   traMADol 50 MG  tablet Commonly known as:  ULTRAM Take 1-2 tablets (50-100 mg total) by mouth every 4 (four) hours as needed for moderate pain.   Turmeric 500 MG Caps Take 1 capsule by mouth daily.   VITAMIN C PO Take 1 tablet by mouth daily.   Vitamin D3 125 MCG (5000 UT) Tabs Take 5,000 Units by mouth daily.            Durable Medical Equipment  (From admission, onward)         Start     Ordered   06/17/18 1337  DME Walker rolling  Once    Question:  Patient needs a walker to treat with the following condition  Answer:  Total knee replacement status   06/17/18 1336   06/17/18 1337  DME Bedside commode  Once    Question:  Patient needs a bedside commode to treat with the following condition  Answer:  Total knee replacement status   06/17/18 1336         Follow-up Information    Ronnette JuniperGaines, Thomas C, PA-C On 07/01/2018.   Specialties:  Orthopedic Surgery, Emergency Medicine Why:  at 9:15am Contact information: [address] [place] [address] [phone]        Donato HeinzHooten, James P, MD On 07/28/2018.   Specialty:  Orthopedic Surgery Why:  at 11:15am Contact information: [address] [facility] [place] [address] [phone]           Signed: Lenard ForthMUNDY, Shalyn Koral 06/19/2018, 7:46 AM   Objective: Vital signs in last 24 hours: Temp:  [97.7 F (36.5 C)-98 F (36.7 C)] 98 F (36.7 C) (05/17 0433) Pulse Rate:  [62-68] 62 (05/17 0433) Resp:  [20] 20 (05/17 0433) BP: (113-124)/(62-84) 120/70 (05/17 0433) SpO2:  [96 %-99 %] 97 % (05/17 0433)  Intake/Output from previous day:  Intake/Output Summary (Last 24 hours) at 06/19/2018 0746 Last data filed at 06/19/2018 0446 Gross per 24 hour  Intake 1486.12 ml  Output 1650 ml  Net -163.88 ml    Intake/Output this shift: No intake/output data recorded.  Labs: No results for input(s): HGB in the last 72 hours. No results for input(s): WBC, RBC, HCT, PLT in the last 72 hours. No results for input(s): NA,  K, CL, CO2, BUN, CREATININE, GLUCOSE, CALCIUM in the last 72 hours. No results for input(s): LABPT, INR in the last 72 hours.  EXAM: General - Patient is Alert and Oriented Extremity - Neurovascular intact Sensation intact distally Dorsiflexion/Plantar flexion intact Compartment soft Incision - clean, dry, with a Hemovac removed.  The Hemovac tubing appeared to be intact on discharge. Motor Function -plantarflexion and dorsiflexion are intact.  Able to straight leg raise independently.  Assessment/Plan: 2 Days Post-Op Procedure(s) (LRB): COMPUTER ASSISTED TOTAL KNEE ARTHROPLASTY LEFT - SLEEP APNEA (Left) Procedure(s) (LRB): COMPUTER ASSISTED TOTAL KNEE ARTHROPLASTY LEFT - SLEEP APNEA (Left) Past Medical History:  Diagnosis Date  . Arthritis   . GERD (gastroesophageal reflux disease)   . Hypertension   . OA (osteoarthritis)    Left Shoulder  .  Sleep apnea 04/2010   sleep study [facility], not using CPAP lost weight    Active Problems:   Total knee replacement status  Estimated body mass index is 33.97 kg/m as calculated from the following:   Height as of this encounter: 5\' 9"  (1.753 m).   Weight as of this encounter: 104.3 kg. Advance diet Up with therapy D/C IV fluids Discharge home with home health Diet - Regular diet Follow up - in 2 weeks Activity - WBAT Disposition - Home Condition Upon Discharge - Good DVT Prophylaxis - Lovenox and TED hose  Dedra Skeens, PA-C Orthopaedic Surgery 06/19/2018, 7:46 AM

## 2018-06-19 NOTE — Plan of Care (Signed)
Patient is pleasant and cooperative with care. Slept well, reports Oxy causes itching and does not want to take it.

## 2018-06-19 NOTE — Progress Notes (Signed)
  Subjective: 2 Days Post-Op Procedure(s) (LRB): COMPUTER ASSISTED TOTAL KNEE ARTHROPLASTY LEFT - SLEEP APNEA (Left) Patient reports pain as mild.   Patient seen in rounds with Dr. Allena Katz. Patient is well, and has had no acute complaints or problems Plan is to go Home after hospital stay. Negative for chest pain and shortness of breath Fever: no Gastrointestinal: Negative for nausea and vomiting  Objective: Vital signs in last 24 hours: Temp:  [97.7 F (36.5 C)-98 F (36.7 C)] 98 F (36.7 C) (05/17 0433) Pulse Rate:  [62-68] 62 (05/17 0433) Resp:  [20] 20 (05/17 0433) BP: (113-124)/(62-84) 120/70 (05/17 0433) SpO2:  [96 %-99 %] 97 % (05/17 0433)  Intake/Output from previous day:  Intake/Output Summary (Last 24 hours) at 06/19/2018 0743 Last data filed at 06/19/2018 0446 Gross per 24 hour  Intake 1486.12 ml  Output 1650 ml  Net -163.88 ml    Intake/Output this shift: No intake/output data recorded.  Labs: No results for input(s): HGB in the last 72 hours. No results for input(s): WBC, RBC, HCT, PLT in the last 72 hours. No results for input(s): NA, K, CL, CO2, BUN, CREATININE, GLUCOSE, CALCIUM in the last 72 hours. No results for input(s): LABPT, INR in the last 72 hours.   EXAM General - Patient is Alert and Oriented Extremity - Neurovascular intact Sensation intact distally Dorsiflexion/Plantar flexion intact Compartment soft Dressing/Incision - clean, dry, with Hemovac removed with no complication.  The Hemovac tubing appeared to be intact on removal.. Motor Function - intact, moving foot and toes well on exam.  Able to do a straight leg raise independently  Past Medical History:  Diagnosis Date  . Arthritis   . GERD (gastroesophageal reflux disease)   . Hypertension   . OA (osteoarthritis)    Left Shoulder  . Sleep apnea 04/2010   sleep study [facility], not using CPAP lost weight     Assessment/Plan: 2 Days Post-Op Procedure(s)  (LRB): COMPUTER ASSISTED TOTAL KNEE ARTHROPLASTY LEFT - SLEEP APNEA (Left) Active Problems:   Total knee replacement status  Estimated body mass index is 33.97 kg/m as calculated from the following:   Height as of this encounter: 5\' 9"  (1.753 m).   Weight as of this encounter: 104.3 kg. Advance diet Up with therapy D/C IV fluids Discharge home with home health plan for today  DVT Prophylaxis - Lovenox, Foot Pumps and TED hose Weight-Bearing as tolerated to left leg  Dedra Skeens, PA-C Orthopaedic Surgery 06/19/2018, 7:43 AM

## 2018-06-19 NOTE — Progress Notes (Signed)
Physical Therapy Treatment Patient Details Name: Darryl Payne MRN: [id number] DOB: Nov 22, 1960 Today's Date: 06/19/2018    History of Present Illness 58 y/o male s/p L TKA 06/17/18.    PT Comments    Patient in bed and eager to participate in therapy. Patient delighted to be discharged today and denies any concerns about his ability to manage at home. Patient demonstrates Mod I with all mobility (bed, transfers, ambulation). Patient able to safely complete 360 degree turns, navigate obstacles, negotiate tight spaces with no unsteadiness nor LOB. Patient ambulated with proper gait mechanics, min VCs for L knee flexion during terminal stance/preswing phase of gait, RW for 570 feet. Patient will benefit from continued PT services on discharge to maximize function s/p L TKA. Patient in chair with phone and needs in reach/met.    Follow Up Recommendations  Home health PT;Follow surgeon's recommendation for DC plan and follow-up therapies     Equipment Recommendations  Rolling walker with 5" wheels(likely will not need BSC)    Recommendations for Other Services       Precautions / Restrictions Restrictions Weight Bearing Restrictions: Yes LLE Weight Bearing: Weight bearing as tolerated    Mobility  Bed Mobility Overal bed mobility: Independent             General bed mobility comments: pt able to transition sit to supine independently with good hand placement and safety awareness.   Transfers Overall transfer level: Modified independent Equipment used: Rolling walker (2 wheeled) Transfers: Sit to/from Stand Sit to Stand: Modified independent (Device/Increase time)         General transfer comment: mod I for sit<>Stand transfer from chair/bed, exhibits good safety awareness; limited use of BUE  Ambulation/Gait Ambulation/Gait assistance: Modified independent (Device/Increase time) Gait Distance (Feet): 570 Feet Assistive device: Rolling walker (2 wheeled) Gait  Pattern/deviations: Step-through pattern;WFL(Within Functional Limits) Gait velocity: Self-selected 2.86 ft/sec   General Gait Details: Patient demonstrates excellent safety awareness and gait mechanics with limited need for RW, but appropriate use nonetheless. Patient benefits from min VCs for increased knee flexion during terminal stance/preswing on LLE.    Stairs             Wheelchair Mobility    Modified Rankin (Stroke Patients Only)       Balance Overall balance assessment: Modified Independent                                          Cognition                                              Exercises Other Exercises Other Exercises: PT reviewed patient's precuations/positioning of LLE as well as HEP. Patient demonstrates AROM L knee flexion ~90 in supine without effort. Other Exercises: Patient demonstrated competence with polar care mgt, compression stocking mgt, and RW mgt.     General Comments        Pertinent Vitals/Pain Pain Assessment: No/denies pain    Home Living                      Prior Function            PT Goals (current goals can now be found in the care plan section) Acute Rehab PT  Goals Patient Stated Goal: go home PT Goal Formulation: With patient Time For Goal Achievement: 07/01/18 Potential to Achieve Goals: Good Progress towards PT goals: Progressing toward goals    Frequency    BID      PT Plan Current plan remains appropriate    Co-evaluation              AM-PAC PT "6 Clicks" Mobility   Outcome Measure  Help needed turning from your back to your side while in a flat bed without using bedrails?: None Help needed moving from lying on your back to sitting on the side of a flat bed without using bedrails?: None Help needed moving to and from a bed to a chair (including a wheelchair)?: None Help needed standing up from a chair using your arms (e.g., wheelchair or bedside  chair)?: None Help needed to walk in hospital room?: None Help needed climbing 3-5 steps with a railing? : A Little 6 Click Score: 23    End of Session Equipment Utilized During Treatment: Gait belt Activity Tolerance: Patient tolerated treatment well Patient left: with call bell/phone within reach;in chair Nurse Communication: Mobility status PT Visit Diagnosis: Muscle weakness (generalized) (M62.81);Difficulty in walking, not elsewhere classified (R26.2);Pain Pain - Right/Left: Left Pain - part of body: Knee     Time: 8127-5170 PT Time Calculation (min) (ACUTE ONLY): 15 min  Charges:  $Therapeutic Activity: 8-22 mins                    Myles Gip PT, DPT [phone] 06/19/2018, 9:15 AM

## 2018-06-20 LAB — POCT I-STAT 4, (NA,K, GLUC, HGB,HCT)
Glucose, Bld: 119 mg/dL — ABNORMAL HIGH (ref 70–99)
HCT: 43 % (ref 39.0–52.0)
Hemoglobin: 14.6 g/dL (ref 13.0–17.0)
Potassium: 3.2 mmol/L — ABNORMAL LOW (ref 3.5–5.1)
Sodium: 140 mmol/L (ref 135–145)

## 2018-06-21 NOTE — TOC Progression Note (Signed)
Transition of Care [facility]) - Progression Note    Patient Details  Name: Darryl Payne MRN: [id number] Date of Birth: 04-27-1960  Transition of Care [facility]) CM/SW Contact  Barrie Dunker, RN Phone Number: 06/21/2018, 11:29 AM  Clinical Narrative:     Received a call from patient's insurance company Case manager, she was inquiring when the patient would expect to be seen by [facility], I called Elnita Maxwell with [facility], due to where the patient lives he will be followed by the [facility] office Elnita Maxwell will call the patient now and explain what is going on with them   Expected Discharge Plan: Home w Home Health Services Barriers to Discharge: No Barriers Identified  Expected Discharge Plan and Services Expected Discharge Plan: Home w Home Health Services   Discharge Planning Services: CM Consult Post Acute Care Choice: Durable Medical Equipment, Home Health Living arrangements for the past 2 months: Single Family Home Expected Discharge Date: 06/19/18               DME Arranged: Dan Humphreys rolling DME Agency: [employer] Date DME Agency Contacted: 06/19/18 Time DME Agency Contacted: [phone] Representative spoke with at DME Agency: brad HH Arranged: PT HH Agency: [facility] Date [facility] Agency Contacted: 06/19/18 Time HH Agency Contacted: [phone] Representative spoke with at [facility] Agency: cheryl   Social Determinants of Health (SDOH) Interventions    Readmission Risk Interventions Readmission Risk Prevention Plan 06/19/2018  Post Dischage Appt Complete  Medication Screening Complete  Transportation Screening Complete  Some recent data might be hidden

## 2019-04-04 DIAGNOSIS — E669 Obesity, unspecified: Secondary | ICD-10-CM | POA: Diagnosis not present

## 2019-04-04 DIAGNOSIS — M1711 Unilateral primary osteoarthritis, right knee: Secondary | ICD-10-CM | POA: Diagnosis not present

## 2019-05-01 ENCOUNTER — Ambulatory Visit: Payer: 59 | Attending: Internal Medicine

## 2019-05-01 ENCOUNTER — Other Ambulatory Visit: Payer: Self-pay

## 2019-05-01 DIAGNOSIS — Z23 Encounter for immunization: Secondary | ICD-10-CM

## 2019-05-01 NOTE — Progress Notes (Signed)
   Covid-19 Vaccination Clinic  Name:  Darryl Payne    MRN: [id number] DOB: 01/23/1961  05/01/2019  Darryl Payne was observed post Covid-19 immunization for 15 minutes without incident. He was provided with Vaccine Information Sheet and instruction to access the V-Safe system.   Darryl Payne was instructed to call 911 with any severe reactions post vaccine: Marland Kitchen Difficulty breathing  . Swelling of face and throat  . A fast heartbeat  . A bad rash all over body  . Dizziness and weakness   Immunizations Administered    Name Date Dose VIS Date Route   Pfizer COVID-19 Vaccine 05/01/2019 12:09 PM 0.3 mL 01/13/2019 Intramuscular   Manufacturer: [employer], [employer]   Lot: [id number]   NDC: [address]-4

## 2019-05-24 ENCOUNTER — Ambulatory Visit: Payer: 59 | Attending: Internal Medicine

## 2019-05-24 DIAGNOSIS — Z23 Encounter for immunization: Secondary | ICD-10-CM

## 2019-07-12 DIAGNOSIS — G8929 Other chronic pain: Secondary | ICD-10-CM | POA: Diagnosis not present

## 2019-07-12 DIAGNOSIS — M25561 Pain in right knee: Secondary | ICD-10-CM | POA: Diagnosis not present

## 2019-07-12 DIAGNOSIS — M1711 Unilateral primary osteoarthritis, right knee: Secondary | ICD-10-CM | POA: Diagnosis not present

## 2019-07-21 DIAGNOSIS — M25561 Pain in right knee: Secondary | ICD-10-CM | POA: Diagnosis not present

## 2019-07-21 DIAGNOSIS — M2391 Unspecified internal derangement of right knee: Secondary | ICD-10-CM | POA: Diagnosis not present

## 2019-07-21 DIAGNOSIS — M1711 Unilateral primary osteoarthritis, right knee: Secondary | ICD-10-CM | POA: Diagnosis not present

## 2019-07-21 DIAGNOSIS — E669 Obesity, unspecified: Secondary | ICD-10-CM | POA: Diagnosis not present

## 2019-07-24 ENCOUNTER — Other Ambulatory Visit: Payer: Self-pay | Admitting: Orthopedic Surgery

## 2019-07-24 DIAGNOSIS — M25561 Pain in right knee: Secondary | ICD-10-CM

## 2019-07-24 DIAGNOSIS — M25361 Other instability, right knee: Secondary | ICD-10-CM

## 2019-07-24 DIAGNOSIS — G8929 Other chronic pain: Secondary | ICD-10-CM

## 2019-07-24 DIAGNOSIS — M2391 Unspecified internal derangement of right knee: Secondary | ICD-10-CM

## 2019-07-24 DIAGNOSIS — M1711 Unilateral primary osteoarthritis, right knee: Secondary | ICD-10-CM

## 2019-08-30 DIAGNOSIS — K219 Gastro-esophageal reflux disease without esophagitis: Secondary | ICD-10-CM | POA: Diagnosis not present

## 2019-08-30 DIAGNOSIS — M25561 Pain in right knee: Secondary | ICD-10-CM | POA: Diagnosis not present

## 2019-08-30 DIAGNOSIS — M150 Primary generalized (osteo)arthritis: Secondary | ICD-10-CM | POA: Diagnosis not present

## 2019-08-30 DIAGNOSIS — G8929 Other chronic pain: Secondary | ICD-10-CM | POA: Diagnosis not present

## 2019-08-30 DIAGNOSIS — I10 Essential (primary) hypertension: Secondary | ICD-10-CM | POA: Diagnosis not present

## 2019-08-30 DIAGNOSIS — E7800 Pure hypercholesterolemia, unspecified: Secondary | ICD-10-CM | POA: Diagnosis not present

## 2019-09-08 DIAGNOSIS — M1711 Unilateral primary osteoarthritis, right knee: Secondary | ICD-10-CM | POA: Diagnosis not present

## 2019-09-19 NOTE — H&P (Signed)
ORTHOPAEDIC HISTORY & PHYSICAL  Progress Notes Breiona Couvillon, Adelina Mings., MD - 09/08/2019 10:30 AM EDT Chief Complaint: Chief Complaint  Patient presents with  . Knee Pain  Right knee degenerative arthrosis, discuss surgery   Reason for Visit: The patient is a 59 y.o. male who presents today for reevaluation of his right knee. He reports a greater than 1 year history of right knee pain. He localizes most of the pain along the medial aspect of the knee. He reports some swelling, no locking, and some giving way of the knee. The pain is aggravated by any weight bearing. The patient has not appreciated any significant improvement despite Tylenol, Celebrex, knee brace, activity modification, intra-articular corticosteroid injections, and viscosupplementation. 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 He is not using any ambulatory aids. The patient states that the knee pain has progressed to the point that it is significantly interfering with his activities of daily living.  Medications: Current Outpatient Medications  Medication Sig Dispense Refill  . acetaminophen (TYLENOL) 325 MG tablet Take 650 mg by mouth every 4 (four) hours as needed for Pain  . amLODIPine-valsartan-hcthiazid 10-320-25 mg Take 1 tablet by mouth once daily 0  . carboxymethylcellulose (REFRESH PLUS) 0.5 % ophthalmic solution Apply 2 drops to eye once daily as needed  . celecoxib (CELEBREX) 200 MG capsule TAKE 1 CAPSULE BY MOUTH TWICE A DAY (Patient not taking: Reported on 07/21/2019)  . cholecalciferol, vitamin D3, (VITAMIN D3) 125 mcg (5,000 unit) tablet Take 5,000 Units by mouth once daily  . docosahexaenoic acid-epa 120-180 mg Cap Take 1 capsule by mouth once daily  . hydroCHLOROthiazide (HYDRODIURIL) 25 MG tablet Take 25 mg by mouth once daily  . ibuprofen (MOTRIN) 800 MG tablet TAKE 1 TABLET (800 MG TOTAL) BY MOUTH EVERY 8 (EIGHT) HOURS AS NEEDED FOR PAIN. (Patient  not taking: Reported on 07/21/2019 ) 270 tablet 0  . multivitamin tablet Take 1 tablet by mouth once daily.  Marland Kitchen mv-mn-vitC-asbNa-Glu-[id number] (AIRBORNE, ASCORBATE SODIUM,) 333-1.7 mg Chew Take 1 tablet by mouth once daily  . naproxen sodium (ALEVE) 220 MG tablet Take 440 mg by mouth 2 (two) times daily with meals (Patient not taking: Reported on 07/21/2019 )  . pantoprazole (PROTONIX) 40 MG DR tablet Take 40 mg by mouth once daily 3  . potassium chloride (K-TAB) 20 mEq TbER ER tablet Take 20 mEq by mouth once daily  . sildenafil, antihypertensive, (REVATIO) 20 mg tablet Take 20 mg by mouth 3 (three) times daily as needed  . turmeric-turmeric root extract 450-50 mg Cap Take 1 caplet by mouth once daily as needed   No current facility-administered medications for this visit.   Allergies: Allergies  Allergen Reactions  . Oxycodone Itching   Past Medical History: Past Medical History:  Diagnosis Date  . GERD (gastroesophageal reflux disease)  . Glenohumeral arthritis, right 06/27/2014  . Hypertension  . Obesity (BMI 35.0-39.9 without comorbidity), unspecified 08/04/2016  . Primary osteoarthritis of right knee 08/04/2016  . Sleep apnea   Past Surgical History: Past Surgical History:  Procedure Laterality Date  . Cardiac catheterization 03/19/2008  [facility]; Normal coronaries, EF 60%, no gradient across AV  . Cardiovascular stress test  . COLONOSCOPY 2012  . LASER VAPORIZATION PROSTATE  . Left ankle surgery 1968  Age 49  . Left knee arthroscopy and medial meniscectomy 09/17/2011  Dr. Rennis Chris  . Left total knee arthroplasty using computer-assisted navigation  06/17/2018  Dr Ernest Pine  . Left total shoulder arthroplasty 04/30/2016  Dr. Ave Filter  . ORIF left ankle fracture  . REPAIR INGUINAL HERNIA  . Transthoracic echocardiogram 04/24/2010  [facility]: NL LV sysolic function, EF 65-70%, mild LVH, grade 1 diastolic dysfinction, mildly dilated RV/RA with normal  RV contraction.   Social History: Social History   Socioeconomic History  . Marital status: Married  Spouse name: Zella Ball  . Number of children: 2  . Years of education: 86  . Highest education level: Not on file  Occupational History  . Occupation: Fulltime- Machinist[employer]  Tobacco Use  . Smoking status: Former Smoker  Types: Cigarettes  . Smokeless tobacco: Never Used  Vaping Use  . Vaping Use: Never used  Substance and Sexual Activity  . Alcohol use: No  . Drug use: No  . Sexual activity: Yes  Partners: Female  Other Topics Concern  . Not on file  Social History Narrative  . Not on file   Social Determinants of Health   Financial Resource Strain:  . Difficulty of Paying Living Expenses:  Food Insecurity:  . Worried About Programme researcher, broadcasting/film/video in the Last Year:  . Barista in the Last Year:  Transportation Needs:  . Freight forwarder (Medical):  Marland Kitchen Lack of Transportation (Non-Medical):  Physical Activity:  . Days of Exercise per Week:  . Minutes of Exercise per Session:  Stress:  . Feeling of Stress :  Social Connections:  . Frequency of Communication with Friends and Family:  . Frequency of Social Gatherings with Friends and Family:  . Attends Religious Services:  . Active Member of Clubs or Organizations:  . Attends Banker Meetings:  Marland Kitchen Marital Status:   Family History: Family History  Problem Relation Age of Onset  . High blood pressure (Hypertension) Father  . Diabetes type II Father  . Lymphoma Sister  . Cancer Brother  . Diabetes Brother   Review of Systems: A comprehensive 14 point ROS was performed, reviewed, and the pertinent orthopaedic findings are documented in the HPI.  Exam BP (!) 142/94  Temp 36.6 C (97.9 F)  Ht 175.3 cm (5\' 9" )  Wt (!) 113.9 kg (251 lb)  BMI 37.07 kg/m   General:  Well-developed, well-nourished male seen in no acute distress.  Antalgic gait. Varus thrust to the right knee.  HEENT:   Atraumatic, normocephalic. Pupils are equal and reactive to light. Extraocular motion is intact. Sclera are clear. Oropharynx is clear with moist mucosa.  Neck:  Supple, nontender, and with good ROM. No thyromegaly, adenopathy, JVD, or carotid bruits.  Lungs:  Clear to auscultation bilaterally.  Cardiovascular:  Regular rate and rhythm. Normal S1, S2. No murmur . No appreciable gallops or rubs. Peripheral pulses are palpable. No lower extremity edema. Homan`s test is negative.  Abdomen:  Soft, nontender, nondistended. Bowel sounds are present.  Extremities: Good strength, stability, and range of motion of the upper extremities. Good range of motion of the hips and ankles.  Right Knee: Soft tissue swelling: mild Effusion: none Erythema: none Crepitance: mild Tenderness: medial Alignment: relative varus Mediolateral laxity: medial pseudolaxity Posterior sag: negative Patellar tracking: Good tracking without evidence of subluxation or tilt Atrophy: No significant atrophy.  Quadriceps tone was fair to good. Range of motion: 0/3/90 degrees  Neurologic:  Awake, alert, and oriented.  Sensory function is intact to pinprick and light touch.  Motor strength is judged to be 5/5.  Motor coordination is within normal  limits.  No apparent clonus. No tremor.   X-rays: I ordered and interpreted standing AP, lateral, and sunrise radiographs of the right knee that were obtained in the office today. There is narrowing of the medial cartilage space with bone-on-bone articulation and associated varus alignment. Osteophyte formation is noted. Subchondral sclerosis is noted. No evidence of fracture or dislocation.   Impression: Degenerative arthrosis of the right knee  Plan:  The findings were discussed in detail with the patient. The patient was given informational material on total knee replacement. Conservative treatment options were reviewed with the patient. We discussed the risks and  benefits of surgical intervention. The usual perioperative course was also discussed in detail. The patient expressed understanding of the risks and benefits of surgical intervention and would like to proceed with plans for right total knee arthroplasty.  MEDICAL CLEARANCE: Per anesthesiology. ACTIVITY: As tolerated. WORK STATUS: Anticipate out of work for 6-8 weeks following surgery. The patient should remain out of work pending surgery. THERAPY: Preoperative physical therapy evaluation. MEDICATIONS: Requested Prescriptions   No prescriptions requested or ordered in this encounter   FOLLOW-UP: Return for preop History & Physical pending surgery date.  Aislee Landgren P. Angie Fava., M.D.  This note was generated in part with voice recognition software and I apologize for any typographical errors that were not detected and corrected.   Electronically signed by Shari Heritage., MD at 09/09/2019 4:47 PM EDT

## 2019-09-20 ENCOUNTER — Other Ambulatory Visit
Admission: RE | Admit: 2019-09-20 | Discharge: 2019-09-20 | Disposition: A | Discharge status: Home / Self Care | Payer: 59 | Source: Ambulatory Visit | Attending: Orthopedic Surgery | Admitting: Orthopedic Surgery

## 2019-09-20 ENCOUNTER — Other Ambulatory Visit: Payer: Self-pay

## 2019-09-20 DIAGNOSIS — Z01818 Encounter for other preprocedural examination: Secondary | ICD-10-CM | POA: Insufficient documentation

## 2019-09-20 LAB — TYPE AND SCREEN
ABO/RH(D): B POS
Antibody Screen: NEGATIVE

## 2019-09-20 LAB — C-REACTIVE PROTEIN: CRP: 0.6 mg/dL (ref <1.0)

## 2019-09-20 LAB — URINALYSIS, ROUTINE W REFLEX MICROSCOPIC
Bacteria, UA: NONE SEEN
Bilirubin Urine: NEGATIVE
Glucose, UA: NEGATIVE mg/dL
Ketones, ur: NEGATIVE mg/dL
Leukocytes,Ua: NEGATIVE
Nitrite: NEGATIVE
Protein, ur: NEGATIVE mg/dL
Specific Gravity, Urine: 1.016 (ref 1.005–1.030)
pH: 6 (ref 5.0–8.0)

## 2019-09-20 LAB — COMPREHENSIVE METABOLIC PANEL
ALT: 17 U/L (ref 0–44)
AST: 20 U/L (ref 15–41)
Albumin: 3.3 g/dL — ABNORMAL LOW (ref 3.5–5.0)
Alkaline Phosphatase: 40 U/L (ref 38–126)
Anion gap: 9 (ref 5–15)
BUN: 16 mg/dL (ref 6–20)
CO2: 27 mmol/L (ref 22–32)
Calcium: 8.9 mg/dL (ref 8.9–10.3)
Chloride: 101 mmol/L (ref 98–111)
Creatinine, Ser: 0.85 mg/dL (ref 0.61–1.24)
GFR calc Af Amer: >60 mL/min (ref >60)
GFR calc non Af Amer: >60 mL/min (ref >60)
Glucose, Bld: 74 mg/dL (ref 70–99)
Potassium: 3.2 mmol/L — ABNORMAL LOW (ref 3.5–5.1)
Sodium: 137 mmol/L (ref 135–145)
Total Bilirubin: 0.6 mg/dL (ref 0.3–1.2)
Total Protein: 7.9 g/dL (ref 6.5–8.1)

## 2019-09-20 LAB — SURGICAL PCR SCREEN
MRSA, PCR: NEGATIVE
Staphylococcus aureus: NEGATIVE

## 2019-09-20 LAB — CBC
HCT: 39.6 % (ref 39.0–52.0)
Hemoglobin: 14 g/dL (ref 13.0–17.0)
MCH: 30 pg (ref 26.0–34.0)
MCHC: 35.4 g/dL (ref 30.0–36.0)
MCV: 85 fL (ref 80.0–100.0)
Platelets: 211 10*3/uL (ref 150–400)
RBC: 4.66 MIL/uL (ref 4.22–5.81)
RDW: 13 % (ref 11.5–15.5)
WBC: 9.2 10*3/uL (ref 4.0–10.5)
nRBC: 0 % (ref 0.0–0.2)

## 2019-09-20 LAB — PROTIME-INR
INR: 1 (ref 0.8–1.2)
Prothrombin Time: 12.8 seconds (ref 11.4–15.2)

## 2019-09-20 LAB — APTT: aPTT: 27 seconds (ref 24–36)

## 2019-09-20 LAB — SEDIMENTATION RATE: Sed Rate: 67 mm/hr — ABNORMAL HIGH (ref 0–20)

## 2019-09-20 NOTE — Progress Notes (Signed)
  [facility] Perioperative Services: Pre-Admission/Anesthesia Testing  Abnormal Lab Notification    Date: 09/20/19  Name: Darryl Payne MRN:   [id number]  Re: Abnormal labs noted during PAT appointment   Provider(s) Notified: Donato Heinz, MD and Lasandra Beech, PA-C Notification mode: Routed and/or faxed via [facility]   ABNORMAL LAB VALUE(S): Lab Results  Component Value Date   K 3.2 (L) 09/20/2019    Notes: Patient is on daily thiazide diuretic (HCTC). He is scheduled for a TKA on 10/02/2019. Will forward to primary surgeon and his APP for review and optimization. Order placed to have SDS re-check K+ level on the morning of surgery. This is a Personal assistant; no formal response is required.  Quentin Mulling, MSN, APRN, FNP-C, CEN [facility]  Peri-operative Services Nurse Practitioner Phone: [phone] 09/20/19 4:29 PM

## 2019-09-20 NOTE — Patient Instructions (Addendum)
Your procedure is scheduled on:Mon 8/30 Report to Day Surgery. To find out your arrival time please call [phone] between 1PM - 3PM on Friday 8/27  Remember: Instructions that are not followed completely may result in serious medical risk,  up to and including death, or upon the discretion of your surgeon and anesthesiologist your  surgery may need to be rescheduled.     _X__ 1. Do not eat food after midnight the night before your procedure.                 No chewing gum or hard candies. You may drink clear liquids up to 2 hours                 before you are scheduled to arrive for your surgery- DO not drink clear                 liquids within 2 hours of the start of your surgery.                 Clear Liquids include:  water, apple juice without pulp, clear Gatorade, G2 or                  Gatorade Zero (avoid Red/Purple/Blue), Black Coffee or Tea (Do not add                 anything to coffee or tea). __x___2.   Complete the "Ensure Clear Pre-surgery Clear Carbohydrate Drink" provided to you, 2 hours before arrival. **If you       are diabetic you will be provided with an alternative drink, Gatorade Zero or G2.  __X__2.  On the morning of surgery brush your teeth with toothpaste and water, you                may rinse your mouth with mouthwash if you wish.  Do not swallow any toothpaste of mouthwash.     ___ 3.  No Alcohol for 24 hours before or after surgery.   ___ 4.  Do Not Smoke or use e-cigarettes For 24 Hours Prior to Your Surgery.                 Do not use any chewable tobacco products for at least 6 hours prior to                 Surgery.  ___  5.  Do not use any recreational drugs (marijuana, cocaine, heroin, ecstasy, MDMA or other)                For at least one week prior to your surgery.  Combination of these drugs with anesthesia                May have life threatening results.  ____  6.  Bring all medications with you on the day of  surgery if instructed.   ___x_  7.  Notify your doctor if there is any change in your medical condition      (cold, fever, infections).     Do not wear jewelry,  Do not wear lotions, powders, You may wear deodorant. Do not shave 48 hours prior to surgery. Men may shave face and neck. Do not bring valuables to the hospital.    [facility] is not responsible for any belongings or valuables.  Contacts, dentures or bridgework may not be worn into surgery. Leave your suitcase in the car. After surgery it may  be brought to your room. For patients admitted to the hospital, discharge time is determined by your treatment team.   Patients discharged the day of surgery will not be allowed to drive home.   Make arrangements for someone to be with you for the first 24 hours of your Same Day Discharge.    Please read over the following fact sheets that you were given:   Incentive spirometer    _x___ Take these medicines the morning of surgery with A SIP OF WATER:    1. pantoprazole (PROTONIX) 40 MG tablet night before and morning of surgery  2. celecoxib (CELEBREX) 200 MG capsule  TAKE 400MG  (2 caps) MORNING OF SURGERY  3. acetaminophen (TYLENOL) 650 MG CR tablet if needed  4.  5.  6.  ____ Fleet Enema (as directed)   __x__ Use CHG Soap (or wipes) as directed  ____ Use Benzoyl Peroxide Gel as instructed  ____ Use inhalers on the day of surgery  ____ Stop metformin 2 days prior to surgery    ____ Take 1/2 of usual insulin dose the night before surgery. No insulin the morning          of surgery.   ____ Stop Coumadin/Plavix/aspirin on   ____ Stop Anti-inflammatories on    __x__ Stop supplements Turmeric 450 MG CAPS, Ascorbic Acid (VITAMIN C) 1000 MG tablet on 8/23  ____ Bring C-Pap to the hospital.    If you have any questions regarding your pre-procedure instructions,  Please call Pre-admit Testing at [phone]    [facility]

## 2019-09-21 LAB — URINE CULTURE
Culture: NO GROWTH
Special Requests: NORMAL

## 2019-09-28 ENCOUNTER — Other Ambulatory Visit: Payer: Self-pay

## 2019-09-28 ENCOUNTER — Other Ambulatory Visit
Admission: RE | Admit: 2019-09-28 | Discharge: 2019-09-28 | Disposition: A | Discharge status: Home / Self Care | Payer: 59 | Source: Ambulatory Visit | Attending: Orthopedic Surgery | Admitting: Orthopedic Surgery

## 2019-09-28 DIAGNOSIS — Z20822 Contact with and (suspected) exposure to covid-19: Secondary | ICD-10-CM | POA: Diagnosis not present

## 2019-09-28 DIAGNOSIS — Z01812 Encounter for preprocedural laboratory examination: Secondary | ICD-10-CM | POA: Diagnosis not present

## 2019-09-28 LAB — SARS CORONAVIRUS 2 (TAT 6-24 HRS): SARS Coronavirus 2: NEGATIVE

## 2019-10-01 ENCOUNTER — Encounter: Payer: Self-pay | Admitting: Orthopedic Surgery

## 2019-10-01 NOTE — H&P (Signed)
ORTHOPAEDIC HISTORY & PHYSICAL  Progress Notes  Latanya Maudlin, PA - 09/27/2019 10:30 AM EDT Formatting of this note is different from the original. Images from the original note were not included. Chief Complaint Chief Complaint  Patient presents with  . Right Knee - Pain  . Knee Pain  H&P-RT TKA-8.30.21/Manisha Cancel   Reason for Visit Darryl Payne is a 59 y.o. who present today for history and physical. He is to undergo a right total knee arthroplasty on 10/02/2019. Was last seen in the clinic on 09/08/2019. There is been no change in his condition since that date.  He reports a greater than 1 year history of right knee pain. He localizes most of the pain along the medial aspect of the knee. He reports some swelling, no locking, and some giving way of the knee. The pain is aggravated by any weight bearing. The patient has not appreciated any significant improvement despite Tylenol, Celebrex, knee brace, activity modification, intra-articular corticosteroid injections, and viscosupplementation. 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 He is not using any ambulatory aids.   Patient states that the pain is increased to the point that is significantly interfering with his activities of daily living. He has elected to proceed with having surgery.   Past Medical History Past Medical History:  Diagnosis Date  . GERD (gastroesophageal reflux disease)  . Glenohumeral arthritis, right 06/27/2014  . Hypertension  . Obesity (BMI 35.0-39.9 without comorbidity), unspecified 08/04/2016  . Primary osteoarthritis of right knee 08/04/2016  . Sleep apnea   Past Surgical History Past Surgical History:  Procedure Laterality Date  . Cardiac catheterization 03/19/2008  [facility]; Normal coronaries, EF 60%, no gradient across AV  . Cardiovascular stress test  . COLONOSCOPY 2012  . LASER VAPORIZATION PROSTATE  . Left ankle  surgery 1968  Age 18  . Left knee arthroscopy and medial meniscectomy 09/17/2011  Dr. Rennis Chris  . Left total knee arthroplasty using computer-assisted navigation 06/17/2018  Dr Ernest Pine  . Left total shoulder arthroplasty 04/30/2016  Dr. Ave Filter  . ORIF left ankle fracture  . REPAIR INGUINAL HERNIA  . Transthoracic echocardiogram 04/24/2010  [facility]: NL LV sysolic function, EF 65-70%, mild LVH, grade 1 diastolic dysfinction, mildly dilated RV/RA with normal RV contraction.   Past Family History Family History  Problem Relation Age of Onset  . High blood pressure (Hypertension) Father  . Diabetes type II Father  . Lymphoma Sister  . Cancer Brother  . Diabetes Brother   Medications Current Outpatient Medications Ordered in Epic  Medication Sig Dispense Refill  . acetaminophen (TYLENOL) 325 MG tablet Take 650 mg by mouth every 4 (four) hours as needed for Pain  . acetaminophen (TYLENOL) 650 MG ER tablet Take by mouth  . amLODIPine-valsartan (EXFORGE) 10-320 mg tablet Take 1 tablet by mouth once daily  . amLODIPine-valsartan-hcthiazid 10-320-25 mg Take 1 tablet by mouth once daily 0  . ascorbic acid, vitamin C, (VITAMIN C) 1000 MG tablet Take by mouth  . carboxymethylcellulose (REFRESH PLUS) 0.5 % ophthalmic solution Apply 2 drops to eye once daily as needed  . celecoxib (CELEBREX) 200 MG capsule TAKE 1 CAPSULE BY MOUTH TWICE A DAY  . cholecalciferol, vitamin D3, (VITAMIN D3) 125 mcg (5,000 unit) tablet Take 5,000 Units by mouth once daily  . docosahexaenoic acid-epa 120-180 mg Cap Take 1 capsule by mouth once daily  . hydroCHLOROthiazide (HYDRODIURIL) 25 MG  tablet Take 25 mg by mouth once daily  . ibuprofen (MOTRIN) 800 MG tablet TAKE 1 TABLET (800 MG TOTAL) BY MOUTH EVERY 8 (EIGHT) HOURS AS NEEDED FOR PAIN. 270 tablet 0  . multivitamin tablet Take 1 tablet by mouth once daily.  . multivitamin-Ca-iron-minerals Tab Take by mouth  . mv-mn-vitC-asbNa-Glu-Lys-hc124  (AIRBORNE, ASCORBATE SODIUM,) 333-1.7 mg Chew Take 1 tablet by mouth once daily  . naproxen sodium (ALEVE) 220 MG tablet Take 440 mg by mouth 2 (two) times daily with meals  . pantoprazole (PROTONIX) 40 MG DR tablet Take 40 mg by mouth once daily 3  . potassium chloride (K-TAB) 20 mEq TbER ER tablet Take 20 mEq by mouth once daily  . sildenafil, antihypertensive, (REVATIO) 20 mg tablet Take 20 mg by mouth 3 (three) times daily as needed  . turmeric-turmeric root extract 450-50 mg Cap Take 1 caplet by mouth once daily as needed   No current Epic-ordered facility-administered medications on file.   Allergies Allergies  Allergen Reactions  . Oxycodone Itching    Review of Systems A comprehensive 14 point ROS was performed, reviewed, and the pertinent orthopaedic findings are documented in the HPI.  Exam BP 134/84  Ht 175.3 cm (5\' 9" )  Wt (!) 115.7 kg (255 lb)  BMI 37.66 kg/m   General: Well-developed well-nourished male seen in no acute distress.   HEENT: Atraumatic,normocephalic. Pupils are equal and reactive to light. Oropharynx is clear with moist mucosa  Lungs: Clear to auscultation bilaterally   Cardiovascular: Regular rate and rhythm. Normal S1, S2. No murmurs. No appreciable gallops or rubs. Peripheral pulses are palpable.  Abdomen: Soft, non-tender, nondistended. Bowel sounds present  Extremity: Right Knee: Soft tissue swelling: mild Effusion: none Erythema: none Crepitance: mild Tenderness: medial Alignment: relative varus Mediolateral laxity: medial pseudolaxity Posterior sag: negative Patellar tracking: Good tracking without evidence of subluxation or tilt Atrophy: No significant atrophy.  Quadriceps tone was fair to good. Range of motion: 0/3/90 degrees   Neurological:  The patient is alert and oriented Sensation to light touch appears to be intact and within normal limits Gross motor strength appeared to be equal to 5/5  Vascular  :  Peripheral pulses felt to be palpable. Capillary refill appears to be intact and within normal limits  X-ray  Standing AP, lateral, and sunrise radiographs of the right knee that were obtained in the office on 09/08/2019 showed narrowing of the medial cartilage space with bone-on-bone articulation and associated varus alignment. Osteophyte formation is noted. Subchondral sclerosis is noted. No evidence of fracture or dislocation.   Impression  1. Degenerative arthrosis of right knee  Plan   1. I have gone over the patient's medication list on today's visit. 2. Discussed operative as well as postoperative course 3. Return to clinic 2 weeks postop  This note was generated in part with voice recognition software and I apologize for any typographical errors that were not detected and corrected   11/08/2019 PA  Electronically signed by Tera Partridge, PA at 09/27/2019 12:23 PM EDT

## 2019-10-02 ENCOUNTER — Encounter
Admission: RE | Disposition: A | Discharge status: Home / Self Care | Payer: Self-pay | Source: Home / Self Care | Attending: Orthopedic Surgery

## 2019-10-02 ENCOUNTER — Encounter: Payer: Self-pay | Admitting: Orthopedic Surgery

## 2019-10-02 ENCOUNTER — Ambulatory Visit
Admission: RE | Admit: 2019-10-02 | Discharge: 2019-10-02 | Disposition: A | Discharge status: Home / Self Care | Payer: 59 | Attending: Orthopedic Surgery | Admitting: Orthopedic Surgery

## 2019-10-02 ENCOUNTER — Ambulatory Visit: Payer: 59 | Admitting: Anesthesiology

## 2019-10-02 ENCOUNTER — Ambulatory Visit: Payer: 59 | Admitting: Urgent Care

## 2019-10-02 ENCOUNTER — Ambulatory Visit: Payer: 59

## 2019-10-02 ENCOUNTER — Other Ambulatory Visit: Payer: Self-pay

## 2019-10-02 DIAGNOSIS — K219 Gastro-esophageal reflux disease without esophagitis: Secondary | ICD-10-CM | POA: Insufficient documentation

## 2019-10-02 DIAGNOSIS — G4730 Sleep apnea, unspecified: Secondary | ICD-10-CM | POA: Diagnosis not present

## 2019-10-02 DIAGNOSIS — Z6835 Body mass index (BMI) 35.0-35.9, adult: Secondary | ICD-10-CM | POA: Insufficient documentation

## 2019-10-02 DIAGNOSIS — Z96659 Presence of unspecified artificial knee joint: Secondary | ICD-10-CM

## 2019-10-02 DIAGNOSIS — Z79899 Other long term (current) drug therapy: Secondary | ICD-10-CM | POA: Diagnosis not present

## 2019-10-02 DIAGNOSIS — Z96652 Presence of left artificial knee joint: Secondary | ICD-10-CM | POA: Diagnosis not present

## 2019-10-02 DIAGNOSIS — E669 Obesity, unspecified: Secondary | ICD-10-CM | POA: Diagnosis not present

## 2019-10-02 DIAGNOSIS — Z87891 Personal history of nicotine dependence: Secondary | ICD-10-CM | POA: Diagnosis not present

## 2019-10-02 DIAGNOSIS — Z96651 Presence of right artificial knee joint: Secondary | ICD-10-CM

## 2019-10-02 DIAGNOSIS — M1711 Unilateral primary osteoarthritis, right knee: Secondary | ICD-10-CM | POA: Insufficient documentation

## 2019-10-02 DIAGNOSIS — Z96612 Presence of left artificial shoulder joint: Secondary | ICD-10-CM | POA: Insufficient documentation

## 2019-10-02 DIAGNOSIS — I10 Essential (primary) hypertension: Secondary | ICD-10-CM | POA: Insufficient documentation

## 2019-10-02 HISTORY — PX: KNEE ARTHROPLASTY: SHX992

## 2019-10-02 LAB — POCT I-STAT, CHEM 8
BUN: 14 mg/dL (ref 6–20)
Calcium, Ion: 1.21 mmol/L (ref 1.15–1.40)
Chloride: 105 mmol/L (ref 98–111)
Creatinine, Ser: 0.8 mg/dL (ref 0.61–1.24)
Glucose, Bld: 106 mg/dL — ABNORMAL HIGH (ref 70–99)
HCT: 43 % (ref 39.0–52.0)
Hemoglobin: 14.6 g/dL (ref 13.0–17.0)
Potassium: 3.4 mmol/L — ABNORMAL LOW (ref 3.5–5.1)
Sodium: 142 mmol/L (ref 135–145)
TCO2: 25 mmol/L (ref 22–32)

## 2019-10-02 SURGERY — ARTHROPLASTY, KNEE, TOTAL, USING IMAGELESS COMPUTER-ASSISTED NAVIGATION
Anesthesia: Spinal | Site: Knee | Laterality: Right

## 2019-10-02 MED ORDER — FENTANYL CITRATE (PF) 100 MCG/2ML IJ SOLN
INTRAMUSCULAR | Status: AC
  Administered 2019-10-02: 25 ug via INTRAVENOUS
  Filled 2019-10-02: qty 2

## 2019-10-02 MED ORDER — ACETAMINOPHEN 10 MG/ML IV SOLN
INTRAVENOUS | Status: AC
  Filled 2019-10-02: qty 100

## 2019-10-02 MED ORDER — PROPOFOL 500 MG/50ML IV EMUL
INTRAVENOUS | Status: DC | PRN
  Administered 2019-10-02: 100 ug/kg/min via INTRAVENOUS

## 2019-10-02 MED ORDER — CEFAZOLIN SODIUM-DEXTROSE 2-4 GM/100ML-% IV SOLN
2.0000 g | Freq: Four times a day (QID) | INTRAVENOUS | Status: DC
  Administered 2019-10-02: 2 g via INTRAVENOUS

## 2019-10-02 MED ORDER — PHENOL 1.4 % MT LIQD
1.0000 | OROMUCOSAL | Status: DC | PRN
  Filled 2019-10-02: qty 177

## 2019-10-02 MED ORDER — MENTHOL 3 MG MT LOZG
1.0000 | LOZENGE | OROMUCOSAL | Status: DC | PRN
  Filled 2019-10-02: qty 9

## 2019-10-02 MED ORDER — DIPHENHYDRAMINE HCL 12.5 MG/5ML PO ELIX
12.5000 mg | ORAL_SOLUTION | ORAL | Status: DC | PRN
  Filled 2019-10-02: qty 10

## 2019-10-02 MED ORDER — ENOXAPARIN SODIUM 40 MG/0.4ML ~~LOC~~ SOLN: 40.0000 mg | SUBCUTANEOUS | Status: DC

## 2019-10-02 MED ORDER — BISACODYL 10 MG RE SUPP
10.0000 mg | Freq: Every day | RECTAL | Status: DC | PRN
  Filled 2019-10-02: qty 1

## 2019-10-02 MED ORDER — MIDAZOLAM HCL 2 MG/2ML IJ SOLN
INTRAMUSCULAR | Status: AC
  Filled 2019-10-02: qty 2

## 2019-10-02 MED ORDER — TRANEXAMIC ACID-NACL 1000-0.7 MG/100ML-% IV SOLN
INTRAVENOUS | Status: AC
  Administered 2019-10-02: 1000 mg via INTRAVENOUS
  Filled 2019-10-02: qty 100

## 2019-10-02 MED ORDER — HYDROCODONE-ACETAMINOPHEN 5-325 MG PO TABS: 1.0000 | ORAL_TABLET | ORAL | 0 refills | Status: AC | PRN

## 2019-10-02 MED ORDER — TRANEXAMIC ACID-NACL 1000-0.7 MG/100ML-% IV SOLN
1000.0000 mg | INTRAVENOUS | Status: AC
  Administered 2019-10-02: 1000 mg via INTRAVENOUS

## 2019-10-02 MED ORDER — HYDROCODONE-ACETAMINOPHEN 5-325 MG PO TABS
ORAL_TABLET | ORAL | Status: AC
  Filled 2019-10-02: qty 1

## 2019-10-02 MED ORDER — BUPIVACAINE HCL (PF) 0.25 % IJ SOLN
INTRAMUSCULAR | Status: AC
  Filled 2019-10-02: qty 30

## 2019-10-02 MED ORDER — METOCLOPRAMIDE HCL 10 MG PO TABS: 10.0000 mg | ORAL_TABLET | Freq: Three times a day (TID) | ORAL | Status: DC

## 2019-10-02 MED ORDER — NEOMYCIN-POLYMYXIN B GU 40-200000 IR SOLN
Status: DC | PRN
  Administered 2019-10-02: 16 mL

## 2019-10-02 MED ORDER — ACETAMINOPHEN 10 MG/ML IV SOLN: 1000.0000 mg | Freq: Four times a day (QID) | INTRAVENOUS | Status: DC

## 2019-10-02 MED ORDER — ENOXAPARIN SODIUM 40 MG/0.4ML ~~LOC~~ SOLN: 40.0000 mg | SUBCUTANEOUS | 0 refills | Status: AC

## 2019-10-02 MED ORDER — SURGIPHOR WOUND IRRIGATION SYSTEM - OPTIME
TOPICAL | Status: DC | PRN
  Administered 2019-10-02: 450 mL via TOPICAL

## 2019-10-02 MED ORDER — CELECOXIB 200 MG PO CAPS: 200.0000 mg | ORAL_CAPSULE | Freq: Two times a day (BID) | ORAL | Status: DC

## 2019-10-02 MED ORDER — SODIUM CHLORIDE 0.9 % IV SOLN
INTRAVENOUS | Status: DC | PRN
  Administered 2019-10-02: 30 ug/min via INTRAVENOUS

## 2019-10-02 MED ORDER — GABAPENTIN 300 MG PO CAPS: 300.0000 mg | ORAL_CAPSULE | Freq: Once | ORAL | Status: AC

## 2019-10-02 MED ORDER — TRANEXAMIC ACID-NACL 1000-0.7 MG/100ML-% IV SOLN
INTRAVENOUS | Status: AC
  Filled 2019-10-02: qty 100

## 2019-10-02 MED ORDER — LACTATED RINGERS IV BOLUS
250.0000 mL | Freq: Once | INTRAVENOUS | Status: AC
  Administered 2019-10-02: 250 mL via INTRAVENOUS

## 2019-10-02 MED ORDER — CEFAZOLIN SODIUM-DEXTROSE 2-4 GM/100ML-% IV SOLN
INTRAVENOUS | Status: AC
  Filled 2019-10-02: qty 100

## 2019-10-02 MED ORDER — SODIUM CHLORIDE 0.9 % IV SOLN
INTRAVENOUS | Status: DC | PRN
  Administered 2019-10-02: 60 mL

## 2019-10-02 MED ORDER — PANTOPRAZOLE SODIUM 40 MG PO TBEC
40.0000 mg | DELAYED_RELEASE_TABLET | Freq: Two times a day (BID) | ORAL | Status: DC
  Filled 2019-10-02: qty 1

## 2019-10-02 MED ORDER — MIDAZOLAM HCL 5 MG/5ML IJ SOLN
INTRAMUSCULAR | Status: DC | PRN
  Administered 2019-10-02: 2 mg via INTRAVENOUS

## 2019-10-02 MED ORDER — TRAMADOL HCL 50 MG PO TABS: 50.0000 mg | ORAL_TABLET | ORAL | Status: DC | PRN

## 2019-10-02 MED ORDER — MAGNESIUM HYDROXIDE 400 MG/5ML PO SUSP
30.0000 mL | Freq: Every day | ORAL | Status: DC
  Filled 2019-10-02: qty 30

## 2019-10-02 MED ORDER — BUPIVACAINE HCL (PF) 0.25 % IJ SOLN
INTRAMUSCULAR | Status: AC
  Filled 2019-10-02: qty 60

## 2019-10-02 MED ORDER — PROPOFOL 500 MG/50ML IV EMUL
INTRAVENOUS | Status: AC
  Filled 2019-10-02: qty 50

## 2019-10-02 MED ORDER — CELECOXIB 200 MG PO CAPS: 200.0000 mg | ORAL_CAPSULE | Freq: Two times a day (BID) | ORAL | 2 refills | Status: AC

## 2019-10-02 MED ORDER — CEFAZOLIN SODIUM-DEXTROSE 2-4 GM/100ML-% IV SOLN
2.0000 g | INTRAVENOUS | Status: AC
  Administered 2019-10-02: 2 g via INTRAVENOUS

## 2019-10-02 MED ORDER — NEOMYCIN-POLYMYXIN B GU 40-200000 IR SOLN
Status: AC
  Filled 2019-10-02: qty 20

## 2019-10-02 MED ORDER — MORPHINE SULFATE (PF) 2 MG/ML IV SOLN: 0.5000 mg | INTRAVENOUS | Status: DC | PRN

## 2019-10-02 MED ORDER — ONDANSETRON HCL 4 MG/2ML IJ SOLN: 4.0000 mg | Freq: Four times a day (QID) | INTRAMUSCULAR | Status: DC | PRN

## 2019-10-02 MED ORDER — ACETAMINOPHEN 10 MG/ML IV SOLN: 1000.0000 mg | Freq: Once | INTRAVENOUS | Status: DC | PRN

## 2019-10-02 MED ORDER — FENTANYL CITRATE (PF) 100 MCG/2ML IJ SOLN
INTRAMUSCULAR | Status: AC
  Filled 2019-10-02: qty 2

## 2019-10-02 MED ORDER — PROPOFOL 10 MG/ML IV BOLUS
INTRAVENOUS | Status: DC | PRN
  Administered 2019-10-02: 50 mg via INTRAVENOUS

## 2019-10-02 MED ORDER — AMLODIPINE BESYLATE 10 MG PO TABS
10.0000 mg | ORAL_TABLET | Freq: Once | ORAL | Status: AC
  Administered 2019-10-02: 10 mg via ORAL
  Filled 2019-10-02: qty 1

## 2019-10-02 MED ORDER — PROPOFOL 10 MG/ML IV BOLUS
INTRAVENOUS | Status: AC
  Filled 2019-10-02: qty 20

## 2019-10-02 MED ORDER — ALUM & MAG HYDROXIDE-SIMETH 200-200-20 MG/5ML PO SUSP: 30.0000 mL | ORAL | Status: DC | PRN

## 2019-10-02 MED ORDER — ACETAMINOPHEN 325 MG PO TABS: 325.0000 mg | ORAL_TABLET | Freq: Four times a day (QID) | ORAL | Status: DC | PRN

## 2019-10-02 MED ORDER — LACTATED RINGERS IV BOLUS
500.0000 mL | Freq: Once | INTRAVENOUS | Status: AC
  Administered 2019-10-02: 500 mL via INTRAVENOUS

## 2019-10-02 MED ORDER — SODIUM CHLORIDE FLUSH 0.9 % IV SOLN
INTRAVENOUS | Status: AC
  Filled 2019-10-02: qty 40

## 2019-10-02 MED ORDER — ONDANSETRON HCL 4 MG/2ML IJ SOLN
INTRAMUSCULAR | Status: DC | PRN
  Administered 2019-10-02: 4 mg via INTRAVENOUS

## 2019-10-02 MED ORDER — CHLORHEXIDINE GLUCONATE 0.12 % MT SOLN
OROMUCOSAL | Status: AC
  Administered 2019-10-02: 15 mL via OROMUCOSAL
  Filled 2019-10-02: qty 15

## 2019-10-02 MED ORDER — HYDROCODONE-ACETAMINOPHEN 7.5-325 MG PO TABS: 1.0000 | ORAL_TABLET | ORAL | Status: DC | PRN

## 2019-10-02 MED ORDER — ACETAMINOPHEN 10 MG/ML IV SOLN
INTRAVENOUS | Status: DC | PRN
  Administered 2019-10-02: 1000 mg via INTRAVENOUS

## 2019-10-02 MED ORDER — LACTATED RINGERS IV SOLN: INTRAVENOUS | Status: DC

## 2019-10-02 MED ORDER — FERROUS SULFATE 325 (65 FE) MG PO TABS
325.0000 mg | ORAL_TABLET | Freq: Two times a day (BID) | ORAL | Status: DC
  Filled 2019-10-02: qty 1

## 2019-10-02 MED ORDER — FENTANYL CITRATE (PF) 100 MCG/2ML IJ SOLN
25.0000 ug | INTRAMUSCULAR | Status: DC | PRN
  Administered 2019-10-02 (×3): 25 ug via INTRAVENOUS

## 2019-10-02 MED ORDER — DEXAMETHASONE SODIUM PHOSPHATE 10 MG/ML IJ SOLN
INTRAMUSCULAR | Status: AC
  Administered 2019-10-02: 8 mg via INTRAVENOUS
  Filled 2019-10-02: qty 1

## 2019-10-02 MED ORDER — GABAPENTIN 300 MG PO CAPS
ORAL_CAPSULE | ORAL | Status: AC
  Administered 2019-10-02: 300 mg via ORAL
  Filled 2019-10-02: qty 1

## 2019-10-02 MED ORDER — GABAPENTIN 300 MG PO CAPS: 300.0000 mg | ORAL_CAPSULE | Freq: Every day | ORAL | Status: DC

## 2019-10-02 MED ORDER — TRANEXAMIC ACID-NACL 1000-0.7 MG/100ML-% IV SOLN: 1000.0000 mg | Freq: Once | INTRAVENOUS | Status: AC

## 2019-10-02 MED ORDER — TRAMADOL HCL 50 MG PO TABS: 50.0000 mg | ORAL_TABLET | Freq: Four times a day (QID) | ORAL | 0 refills | Status: AC | PRN

## 2019-10-02 MED ORDER — ENSURE ENLIVE PO LIQD
296.0000 mL | Freq: Once | ORAL | Status: AC
  Administered 2019-10-02: 296 mL via ORAL

## 2019-10-02 MED ORDER — CHLORHEXIDINE GLUCONATE 4 % EX LIQD: 60.0000 mL | Freq: Once | CUTANEOUS | Status: DC

## 2019-10-02 MED ORDER — FENTANYL CITRATE (PF) 100 MCG/2ML IJ SOLN
INTRAMUSCULAR | Status: AC
  Administered 2019-10-02: 50 ug via INTRAVENOUS
  Filled 2019-10-02: qty 2

## 2019-10-02 MED ORDER — ORAL CARE MOUTH RINSE: 15.0000 mL | Freq: Once | OROMUCOSAL | Status: AC

## 2019-10-02 MED ORDER — SODIUM CHLORIDE 0.9 % IV SOLN: INTRAVENOUS | Status: DC

## 2019-10-02 MED ORDER — BUPIVACAINE HCL (PF) 0.25 % IJ SOLN
INTRAMUSCULAR | Status: DC | PRN
  Administered 2019-10-02: 60 mL

## 2019-10-02 MED ORDER — TRAMADOL HCL 50 MG PO TABS
ORAL_TABLET | ORAL | Status: AC
  Administered 2019-10-02: 50 mg via ORAL
  Filled 2019-10-02: qty 1

## 2019-10-02 MED ORDER — ONDANSETRON HCL 4 MG PO TABS: 4.0000 mg | ORAL_TABLET | Freq: Four times a day (QID) | ORAL | Status: DC | PRN

## 2019-10-02 MED ORDER — BUPIVACAINE HCL (PF) 0.5 % IJ SOLN
INTRAMUSCULAR | Status: DC | PRN
  Administered 2019-10-02: 3 mL

## 2019-10-02 MED ORDER — CHLORHEXIDINE GLUCONATE 0.12 % MT SOLN: 15.0000 mL | Freq: Once | OROMUCOSAL | Status: AC

## 2019-10-02 MED ORDER — DEXAMETHASONE SODIUM PHOSPHATE 10 MG/ML IJ SOLN: 8.0000 mg | Freq: Once | INTRAMUSCULAR | Status: AC

## 2019-10-02 MED ORDER — FENTANYL CITRATE (PF) 100 MCG/2ML IJ SOLN
INTRAMUSCULAR | Status: DC | PRN
  Administered 2019-10-02 (×2): 50 ug via INTRAVENOUS

## 2019-10-02 MED ORDER — ONDANSETRON HCL 4 MG/2ML IJ SOLN: 4.0000 mg | Freq: Once | INTRAMUSCULAR | Status: DC | PRN

## 2019-10-02 MED ORDER — HYDROCODONE-ACETAMINOPHEN 5-325 MG PO TABS
1.0000 | ORAL_TABLET | ORAL | Status: DC | PRN
  Administered 2019-10-02: 1 via ORAL

## 2019-10-02 MED ORDER — BUPIVACAINE LIPOSOME 1.3 % IJ SUSP
INTRAMUSCULAR | Status: AC
  Filled 2019-10-02: qty 20

## 2019-10-02 MED ORDER — SENNOSIDES-DOCUSATE SODIUM 8.6-50 MG PO TABS
1.0000 | ORAL_TABLET | Freq: Two times a day (BID) | ORAL | Status: DC
  Filled 2019-10-02: qty 1

## 2019-10-02 MED ORDER — FLEET ENEMA 7-19 GM/118ML RE ENEM: 1.0000 | ENEMA | Freq: Once | RECTAL | Status: DC | PRN

## 2019-10-02 SURGICAL SUPPLY — 81 items
ATTUNE MED DOME PAT 41 KNEE (Knees) ×1 IMPLANT
ATTUNE MED DOME PAT 41MM KNEE (Knees) ×1 IMPLANT
ATTUNE PS FEM RT SZ 6 CEM KNEE (Femur) ×2 IMPLANT
ATTUNE PSRP INSR SZ6 6 KNEE (Insert) ×1 IMPLANT
ATTUNE PSRP INSR SZ6 6MM KNEE (Insert) ×1 IMPLANT
BASE TIBIA ATTUNE KNEE SYS SZ6 (Knees) IMPLANT
BATTERY INSTRU NAVIGATION (MISCELLANEOUS) ×12 IMPLANT
BLADE SAW 70X12.5 (BLADE) ×3 IMPLANT
BLADE SAW 90X13X1.19 OSCILLAT (BLADE) ×3 IMPLANT
BLADE SAW 90X25X1.19 OSCILLAT (BLADE) ×3 IMPLANT
BSPLAT TIB 6 CMNT ROT PLAT STR (Knees) ×1 IMPLANT
BTRY SRG DRVR LF (MISCELLANEOUS) ×4
CANISTER PREVENA PLUS 150 (CANNISTER) ×2 IMPLANT
CANISTER SUCT 3000ML PPV (MISCELLANEOUS) ×3 IMPLANT
CEMENT HV SMART SET (Cement) ×4 IMPLANT
COOLER POLAR GLACIER W/PUMP (MISCELLANEOUS) ×3 IMPLANT
COVER WAND RF STERILE (DRAPES) ×3 IMPLANT
CUFF TOURN SGL QUICK 30 (TOURNIQUET CUFF)
CUFF TOURN SGL QUICK 34 (TOURNIQUET CUFF) ×3
CUFF TRNQT CYL 30X4X21-28X (TOURNIQUET CUFF) IMPLANT
CUFF TRNQT CYL 34X4.125X (TOURNIQUET CUFF) IMPLANT
DRAPE 3/4 80X56 (DRAPES) ×3 IMPLANT
DRSG DERMACEA 8X12 NADH (GAUZE/BANDAGES/DRESSINGS) ×3 IMPLANT
DRSG OPSITE POSTOP 4X14 (GAUZE/BANDAGES/DRESSINGS) ×3 IMPLANT
DRSG TEGADERM 4X4.75 (GAUZE/BANDAGES/DRESSINGS) ×3 IMPLANT
DURAPREP 26ML APPLICATOR (WOUND CARE) ×6 IMPLANT
ELECT REM PT RETURN 9FT ADLT (ELECTROSURGICAL) ×3
ELECTRODE REM PT RTRN 9FT ADLT (ELECTROSURGICAL) ×1 IMPLANT
EX-PIN ORTHOLOCK NAV 4X150 (PIN) ×6 IMPLANT
GLOVE BIO SURGEON STRL SZ7.5 (GLOVE) ×6 IMPLANT
GLOVE BIOGEL M STRL SZ7.5 (GLOVE) ×6 IMPLANT
GLOVE BIOGEL PI IND STRL 7.5 (GLOVE) ×1 IMPLANT
GLOVE BIOGEL PI INDICATOR 7.5 (GLOVE) ×2
GLOVE INDICATOR 8.0 STRL GRN (GLOVE) ×3 IMPLANT
GOWN STRL REUS W/ TWL LRG LVL3 (GOWN DISPOSABLE) ×2 IMPLANT
GOWN STRL REUS W/ TWL XL LVL3 (GOWN DISPOSABLE) ×1 IMPLANT
GOWN STRL REUS W/TWL LRG LVL3 (GOWN DISPOSABLE) ×6
GOWN STRL REUS W/TWL XL LVL3 (GOWN DISPOSABLE) ×3
HEMOVAC 400CC 10FR (MISCELLANEOUS) ×3 IMPLANT
HOLDER FOLEY CATH W/STRAP (MISCELLANEOUS) ×3 IMPLANT
HOOD PEEL AWAY FLYTE STAYCOOL (MISCELLANEOUS) ×6 IMPLANT
IRRIGATION SURGIPHOR STRL (IV SOLUTION) ×3 IMPLANT
KIT TURNOVER KIT A (KITS) ×3 IMPLANT
KNIFE SCULPS 14X20 (INSTRUMENTS) ×3 IMPLANT
LABEL OR SOLS (LABEL) ×3 IMPLANT
MANIFOLD NEPTUNE II (INSTRUMENTS) ×3 IMPLANT
NDL SAFETY ECLIPSE 18X1.5 (NEEDLE) ×1 IMPLANT
NDL SPNL 20GX3.5 QUINCKE YW (NEEDLE) ×2 IMPLANT
NEEDLE HYPO 18GX1.5 SHARP (NEEDLE) ×3
NEEDLE SPNL 20GX3.5 QUINCKE YW (NEEDLE) ×6 IMPLANT
NS IRRIG 500ML POUR BTL (IV SOLUTION) ×3 IMPLANT
PACK TOTAL KNEE (MISCELLANEOUS) ×3 IMPLANT
PAD WRAPON POLAR KNEE (MISCELLANEOUS) ×1 IMPLANT
PENCIL SMOKE EVACUATOR COATED (MISCELLANEOUS) ×3 IMPLANT
PENCIL SMOKE ULTRAEVAC 22 CON (MISCELLANEOUS) ×3 IMPLANT
PIN DRILL QUICK PACK ×3 IMPLANT
PIN FIXATION 1/8DIA X 3INL (PIN) ×9 IMPLANT
PREVENA INCISION MGT 90 150 (MISCELLANEOUS) ×2 IMPLANT
PULSAVAC PLUS IRRIG FAN TIP (DISPOSABLE) ×3
SOL .9 NS 3000ML IRR  AL (IV SOLUTION) ×2
SOL .9 NS 3000ML IRR AL (IV SOLUTION) ×1
SOL .9 NS 3000ML IRR UROMATIC (IV SOLUTION) ×1 IMPLANT
SOL PREP PVP 2OZ (MISCELLANEOUS) ×3
SOLUTION PREP PVP 2OZ (MISCELLANEOUS) ×1 IMPLANT
SPONGE DRAIN TRACH 4X4 STRL 2S (GAUZE/BANDAGES/DRESSINGS) ×3 IMPLANT
STAPLER SKIN PROX 35W (STAPLE) ×3 IMPLANT
STOCKINETTE IMPERV 14X48 (MISCELLANEOUS) IMPLANT
STRAP TIBIA SHORT (MISCELLANEOUS) ×3 IMPLANT
SUCTION FRAZIER HANDLE 10FR (MISCELLANEOUS) ×2
SUCTION TUBE FRAZIER 10FR DISP (MISCELLANEOUS) ×1 IMPLANT
SUT VIC AB 0 CT1 36 (SUTURE) ×6 IMPLANT
SUT VIC AB 1 CT1 36 (SUTURE) ×8 IMPLANT
SUT VIC AB 2-0 CT2 27 (SUTURE) ×3 IMPLANT
SYR 20ML LL LF (SYRINGE) ×3 IMPLANT
SYR 30ML LL (SYRINGE) ×6 IMPLANT
TIBIA ATTUNE KNEE SYS BASE SZ6 (Knees) ×3 IMPLANT
TIP FAN IRRIG PULSAVAC PLUS (DISPOSABLE) ×1 IMPLANT
TOWEL OR 17X26 4PK STRL BLUE (TOWEL DISPOSABLE) ×3 IMPLANT
TOWER CARTRIDGE SMART MIX (DISPOSABLE) ×3 IMPLANT
TRAY FOLEY MTR SLVR 16FR STAT (SET/KITS/TRAYS/PACK) ×3 IMPLANT
WRAPON POLAR PAD KNEE (MISCELLANEOUS) ×3

## 2019-10-02 NOTE — Anesthesia Postprocedure Evaluation (Signed)
Anesthesia Post Note  Patient: Darryl Payne  Procedure(s) Performed: COMPUTER ASSISTED TOTAL KNEE ARTHROPLASTY (Right Knee)  Patient location during evaluation: PACU Anesthesia Type: Spinal Level of consciousness: oriented and awake and alert Pain management: pain level controlled Vital Signs Assessment: post-procedure vital signs reviewed and stable Respiratory status: spontaneous breathing, respiratory function stable and patient connected to nasal cannula oxygen Cardiovascular status: blood pressure returned to baseline and stable Postop Assessment: no headache, no backache and no apparent nausea or vomiting Anesthetic complications: no   No complications documented.   Last Vitals:  Vitals:   10/02/19 1152 10/02/19 1200  BP:  130/87  Pulse: 83 71  Resp: 14 12  Temp:    SpO2: 90% 94%    Last Pain:  Vitals:   10/02/19 1152  TempSrc:   PainSc: Asleep                 Corinda Gubler

## 2019-10-02 NOTE — Discharge Instructions (Addendum)
Instructions after Total Knee Replacement   James P. Hooten, Jr., M.D.     Dept. of Orthopaedics & Sports Medicine  [facility]  [address]  [place], [place]  [address]  Phone: [phone]   Fax: [phone]    DIET: . Drink plenty of non-alcoholic fluids. . Resume your normal diet. Include foods high in fiber.  ACTIVITY:  . You may use crutches or a walker with weight-bearing as tolerated, unless instructed otherwise. . You may be weaned off of the walker or crutches by your Physical Therapist.  . Do NOT place pillows under the knee. Anything placed under the knee could limit your ability to straighten the knee.   . Continue doing gentle exercises. Exercising will reduce the pain and swelling, increase motion, and prevent muscle weakness.   . Please continue to use the TED compression stockings for 6 weeks. You may remove the stockings at night, but should reapply them in the morning. . Do not drive or operate any equipment until instructed.  WOUND CARE:  . Continue to use the PolarCare or ice packs periodically to reduce pain and swelling. . You may bathe or shower after the staples are removed at the first office visit following surgery.  MEDICATIONS: . You may resume your regular medications. . Please take the pain medication as prescribed on the medication. . Do not take pain medication on an empty stomach. . You have been given a prescription for a blood thinner (Lovenox or Coumadin). Please take the medication as instructed. (NOTE: After completing a 2 week course of Lovenox, take one Enteric-coated aspirin once a day. This along with elevation will help reduce the possibility of phlebitis in your operated leg.) . Do not drive or drink alcoholic beverages when taking pain medications.  CALL THE OFFICE FOR: . Temperature above 101 degrees . Excessive bleeding or drainage on the dressing. . Excessive swelling, coldness, or paleness of the toes. . Persistent  nausea and vomiting.  FOLLOW-UP:  . You should have an appointment to return to the office in 10-14 days after surgery. . Arrangements have been made for continuation of Physical Therapy (either home therapy or outpatient therapy).     [facility] Department Directory         [email or web address]       [email or web address]          Cardiology  Appointments: [place] - [phone] [place] - [phone]  Endocrinology  Appointments: [place] - [phone] [place] - [phone]  Gastroenterology  Appointments: [place] - [phone] [place] - [phone]        General Surgery   Appointments: [place] - [phone]  Internal Medicine/Family Medicine  Appointments: [place] - [phone] [place] - [phone] [place] - [phone]  Metabolic and Weigh Loss Surgery  Appointments: [place] - [phone]        Neurology  Appointments: [place] - [phone] [place] - [phone]  Neurosurgery  Appointments: [place] - [phone]  Obstetrics & Gynecology  Appointments: [place] - [phone] [place] - [phone]        Pediatrics  Appointments: [place] - [phone] [place] - [phone]  Physiatry  Appointments: [place] -[phone]  Physical Therapy  Appointments: [place] - [phone] [place] - [phone]        Podiatry  Appointments: [place] - [phone] [place] - [phone]  Pulmonology  Appointments: [place] - [phone]  Rheumatology  Appointments: [place] - [phone]         Location: [facility]  [address] , [place]  [address]  [place] Location: [facility] [address] [place], [place]  [address]  [place] Location: [facility] [address] [place], [place]  [address]     Bupivacaine Liposomal Suspension for Injection What is this medicine? BUPIVACAINE LIPOSOMAL (bue PIV a kane LIP oh som al) is an  anesthetic. It causes loss of feeling in the skin or other tissues. It is used to prevent and to treat pain from some procedures. This medicine may be used for other purposes; ask your health care provider or pharmacist if you have questions. COMMON BRAND NAME(S): EXPAREL What should I tell my health care provider before I take this medicine? They need to know if you have any of these conditions:  G6PD deficiency  heart disease  kidney disease  liver disease  low blood pressure  lung or breathing disease, like asthma  an unusual or allergic reaction to bupivacaine, other medicines, foods, dyes, or preservatives  pregnant or trying to get pregnant  breast-feeding How should I use this medicine? This medicine is for injection into the affected area. It is given by a health care professional in a hospital or clinic setting. Talk to your pediatrician regarding the use of this medicine in children. Special care may be needed. Overdosage: If you think you have taken too much of this medicine contact a poison control center or emergency room at once. NOTE: This medicine is only for you. Do not share this medicine with others. What if I miss a dose? This does not apply. What may interact with this medicine? This medicine may interact with the following medications:  acetaminophen  certain antibiotics like dapsone, nitrofurantoin, aminosalicylic acid, sulfonamides  certain medicines for seizures like phenobarbital, phenytoin, valproic acid  chloroquine  cyclophosphamide  flutamide  hydroxyurea  ifosfamide  metoclopramide  nitric oxide  nitroglycerin  nitroprusside  nitrous oxide  other local anesthetics like lidocaine, pramoxine, tetracaine  primaquine  quinine  rasburicase  sulfasalazine This list may not describe all possible interactions. Give your health care provider a list of all the medicines, herbs, non-prescription drugs, or dietary supplements you  use. Also tell them if you smoke, drink alcohol, or use illegal drugs. Some items may interact with your medicine. What should I watch for while using this medicine? Your condition will be monitored carefully while you are receiving this medicine. Be careful to avoid injury while the area is numb, and you are not aware of pain. What side effects may I notice from receiving this medicine? Side effects that you should report to your doctor or health care professional as soon as possible:  allergic reactions like skin rash, itching or hives, swelling of the face, lips, or tongue  seizures  signs and symptoms of a dangerous change in heartbeat or heart rhythm like chest pain; dizziness; fast, irregular heartbeat; palpitations; feeling faint or lightheaded; falls; breathing problems  signs and symptoms of methemoglobinemia such as pale, gray, or blue colored skin; headache; fast heartbeat; shortness of breath; feeling faint or lightheaded, falls; tiredness Side effects that usually do not require medical attention (report to your doctor or health care professional if they continue or are bothersome):  anxious  back pain  changes in taste  changes in vision  constipation  dizziness  fever  nausea, vomiting This list may not describe all possible side effects. Call your doctor for medical advice about side effects. You may report side effects to FDA at 1-800-FDA-1088. Where should I keep my medicine? This drug is given in a hospital or clinic and   will not be stored at home. NOTE: This sheet is a summary. It may not cover all possible information. If you have questions about this medicine, talk to your doctor, pharmacist, or health care provider.  2020 Elsevier/Gold Standard (2018-11-01 10:48:23)   AMBULATORY SURGERY  DISCHARGE INSTRUCTIONS   1) The drugs that you were given will stay in your system until tomorrow so for the next 24 hours you should not:  A) Drive an  automobile B) Make any legal decisions C) Drink any alcoholic beverage   2) You may resume regular meals tomorrow.  Today it is better to start with liquids and gradually work up to solid foods.  You may eat anything you prefer, but it is better to start with liquids, then soup and crackers, and gradually work up to solid foods.   3) Please notify your doctor immediately if you have any unusual bleeding, trouble breathing, redness and pain at the surgery site, drainage, fever, or pain not relieved by medication.    4) Additional Instructions:        Please contact your physician with any problems or Same Day Surgery at [phone], Monday through Friday 6 am to 4 pm, or [place] at [place] Main number at [phone]. 

## 2019-10-02 NOTE — OR Nursing (Signed)
Dr. Ernest Pine in to see pt in postop @ 1320.  Advises would like to see again in postop prior to discharge.

## 2019-10-02 NOTE — Op Note (Signed)
OPERATIVE NOTE  DATE OF SURGERY:  10/02/2019  PATIENT NAME:  Darryl Payne   DOB: 1960/10/14  MRN: [id number]  PRE-OPERATIVE DIAGNOSIS: Degenerative arthrosis of the right knee, primary  POST-OPERATIVE DIAGNOSIS:  Same  PROCEDURE:  Right total knee arthroplasty using computer-assisted navigation  SURGEON:  Jena Gauss. M.D.  ASSISTANT: Baldwin Jamaica, PA-C (present and scrubbed throughout the case, critical for assistance with exposure, retraction, instrumentation, and closure)  ANESTHESIA: spinal  ESTIMATED BLOOD LOSS: 50 mL  FLUIDS REPLACED: 1200 mL of crystalloid  TOURNIQUET TIME: 101 minutes  DRAINS: 2 medium Hemovac drains  SOFT TISSUE RELEASES: Anterior cruciate ligament, posterior cruciate ligament, deep medial collateral ligament, patellofemoral ligament  IMPLANTS UTILIZED: DePuy Attune size 6 posterior stabilized femoral component (cemented), size 6 rotating platform tibial component (cemented), 41 mm medialized dome patella (cemented), and a 6 mm stabilized rotating platform polyethylene insert.  INDICATIONS FOR SURGERY: Darryl Payne is a 59 y.o. year old male with a long history of progressive knee pain. X-rays demonstrated severe degenerative changes in tricompartmental fashion. The patient had not seen any significant improvement despite conservative nonsurgical intervention. After discussion of the risks and benefits of surgical intervention, the patient expressed understanding of the risks benefits and agree with plans for total knee arthroplasty.   The risks, benefits, and alternatives were discussed at length including but not limited to the risks of infection, bleeding, nerve injury, stiffness, blood clots, the need for revision surgery, cardiopulmonary complications, among others, and they were willing to proceed.  PROCEDURE IN DETAIL: The patient was brought into the operating room and, after adequate spinal anesthesia was achieved, a tourniquet was  placed on the patient's upper thigh. The patient's knee and leg were cleaned and prepped with alcohol and DuraPrep and draped in the usual sterile fashion. A "timeout" was performed as per usual protocol. The lower extremity was exsanguinated using an Esmarch, and the tourniquet was inflated to 300 mmHg. An anterior longitudinal incision was made followed by a standard mid vastus approach. The deep fibers of the medial collateral ligament were elevated in a subperiosteal fashion off of the medial flare of the tibia so as to maintain a continuous soft tissue sleeve. The patella was subluxed laterally and the patellofemoral ligament was incised. Inspection of the knee demonstrated severe degenerative changes with full-thickness loss of articular cartilage. Osteophytes were debrided using a rongeur. Anterior and posterior cruciate ligaments were excised. Two 4.0 mm Schanz pins were inserted in the femur and into the tibia for attachment of the array of trackers used for computer-assisted navigation. Hip center was identified using a circumduction technique. Distal landmarks were mapped using the computer. The distal femur and proximal tibia were mapped using the computer. The distal femoral cutting guide was positioned using computer-assisted navigation so as to achieve a 5 distal valgus cut. The femur was sized and it was felt that a size 6 femoral component was appropriate. A size 6 femoral cutting guide was positioned and the anterior cut was performed and verified using the computer. This was followed by completion of the posterior and chamfer cuts. Femoral cutting guide for the central box was then positioned in the center box cut was performed.  Attention was then directed to the proximal tibia. Medial and lateral menisci were excised. The extramedullary tibial cutting guide was positioned using computer-assisted navigation so as to achieve a 0 varus-valgus alignment and 3 posterior slope. The cut was  performed and verified using the computer. The proximal tibia was  sized and it was felt that a size 6 tibial tray was appropriate. Tibial and femoral trials were inserted followed by insertion of a 6 mm polyethylene insert. This allowed for excellent mediolateral soft tissue balancing both in flexion and in full extension. Finally, the patella was cut and prepared so as to accommodate a 41 mm medialized dome patella. A patella trial was placed and the knee was placed through a range of motion with excellent patellar tracking appreciated. The femoral trial was removed after debridement of posterior osteophytes. The central post-hole for the tibial component was reamed followed by insertion of a keel punch. Tibial trials were then removed. Cut surfaces of bone were irrigated with copious amounts of normal saline with antibiotic solution using pulsatile lavage and then suctioned dry. Polymethylmethacrylate cement was prepared in the usual fashion using a vacuum mixer. Cement was applied to the cut surface of the proximal tibia as well as along the undersurface of a size 6 rotating platform tibial component. Tibial component was positioned and impacted into place. Excess cement was removed using Personal assistant. Cement was then applied to the cut surfaces of the femur as well as along the posterior flanges of the size 6 femoral component. The femoral component was positioned and impacted into place. Excess cement was removed using Personal assistant. A 6 mm polyethylene trial was inserted and the knee was brought into full extension with steady axial compression applied. Finally, cement was applied to the backside of a 41 mm medialized dome patella and the patellar component was positioned and patellar clamp applied. Excess cement was removed using Personal assistant. After adequate curing of the cement, the tourniquet was deflated after a total tourniquet time of 101 minutes. Hemostasis was achieved using electrocautery. The  knee was irrigated with copious amounts of normal saline with antibiotic solution using pulsatile lavage and then suctioned dry. 20 mL of 1.3% Exparel and 60 mL of 0.25% Marcaine in 40 mL of normal saline was injected along the posterior capsule, medial and lateral gutters, and along the arthrotomy site. A 6 mm stabilized rotating platform polyethylene insert was inserted and the knee was placed through a range of motion with excellent mediolateral soft tissue balancing appreciated and excellent patellar tracking noted. 2 medium drains were placed in the wound bed and brought out through separate stab incisions. The medial parapatellar portion of the incision was reapproximated using interrupted sutures of #1 Vicryl. Subcutaneous tissue was approximated in layers using first #0 Vicryl followed #2-0 Vicryl. The skin was approximated with skin staples. A sterile dressing was applied.  The patient tolerated the procedure well and was transported to the recovery room in stable condition.    Patra Gherardi P. Angie Fava., M.D.

## 2019-10-02 NOTE — OR Nursing (Signed)
OT in for vist @ 1457.

## 2019-10-02 NOTE — Anesthesia Preprocedure Evaluation (Signed)
Anesthesia Evaluation  Patient identified by MRN, date of birth, ID band Patient awake    Reviewed: Allergy & Precautions, H&P , NPO status , Patient's Chart, lab work & pertinent test results  History of Anesthesia Complications Negative for: history of anesthetic complications  Airway Mallampati: II  TM Distance: >3 FB Neck ROM: full    Dental  (+) Chipped, Missing,    Pulmonary neg shortness of breath, sleep apnea , former smoker,    breath sounds clear to auscultation       Cardiovascular Exercise Tolerance: Good hypertension, (-) angina(-) Past MI and (-) DOE  Rhythm:Regular Rate:Normal - Systolic murmurs    Neuro/Psych negative neurological ROS  negative psych ROS   GI/Hepatic Neg liver ROS, GERD  Medicated and Controlled,  Endo/Other  negative endocrine ROS  Renal/GU      Musculoskeletal   Abdominal   Peds  Hematology negative hematology ROS (+)   Anesthesia Other Findings Past Medical History: No date: Arthritis No date: GERD (gastroesophageal reflux disease) No date: Hypertension No date: OA (osteoarthritis)     Comment:  Left Shoulder 04/2010: Sleep apnea     Comment:  sleep study [facility], not using CPAP               lost weight   Past Surgical History: No date: CARDIAC CATHETERIZATION     Comment:  03/19/08 [facility]): Normal coronaries,               EF 60%, no sign gradient across AV.  No date: CARDIOVASCULAR STRESS TEST 02/02/2010: COLONOSCOPY     Comment:  normal/ repeat in 10 yrs- [place] 59 yo: FRACTURE SURGERY     Comment:  left ankle No date: HERNIA REPAIR 09/17/2011: KNEE ARTHROSCOPY     Comment:  Procedure: ARTHROSCOPY KNEE;  Surgeon: Senaida Lange,               MD;  Location: [facility] OR;  Service: Orthopedics;  Laterality:              Left;  with Medial meniscectomy and debridement No date: PROSTATE SURGERY     Comment:  laser  04/30/2016: TOTAL  SHOULDER ARTHROPLASTY; Left     Comment:  Procedure: TOTAL SHOULDER ARTHROPLASTY;  Surgeon: Jones Broom, MD;  Location: [facility] OR;  Service: Orthopedics;                Laterality: Left;  Left total shoulder arthroplasty No date: TRANSTHORACIC ECHOCARDIOGRAM     Comment:  04/24/10 [facility]): NL LV systolic               function, EF 65-70%, mild LVH, grade 1 diatolic               dysfunction, mildly dilated RV/RA with normal RV               contraction.   BMI    Body Mass Index:  33.97 kg/m      Reproductive/Obstetrics negative OB ROS                             Anesthesia Physical  Anesthesia Plan  ASA: II  Anesthesia Plan: Spinal   Post-op Pain Management:    Induction: Intravenous  PONV Risk Score and Plan: 1 and Ondansetron, Dexamethasone, Propofol infusion, TIVA and Midazolam  Airway  Management Planned: Natural Airway and Nasal Cannula  Additional Equipment: None  Intra-op Plan:   Post-operative Plan:   Informed Consent: I have reviewed the patients History and Physical, chart, labs and discussed the procedure including the risks, benefits and alternatives for the proposed anesthesia with the patient or authorized representative who has indicated his/her understanding and acceptance.     Dental Advisory Given  Plan Discussed with: Anesthesiologist, CRNA and Surgeon  Anesthesia Plan Comments: (Patient reports no bleeding problems and no anticoagulant use.  Plan for spinal with backup GA  Patient consented for risks of anesthesia including but not limited to:  - adverse reactions to medications - risk of bleeding, infection, nerve damage and headache - risk of failed spinal - damage to teeth, lips or other oral mucosa - sore throat or hoarseness - Damage to heart, brain, lungs or loss of life  Patient voiced understanding.)        Anesthesia Quick Evaluation

## 2019-10-02 NOTE — Progress Notes (Signed)
°   10/02/19 0750  Clinical Encounter Type  Visited With Family  Visit Type Initial  Referral From Chaplain  Consult/Referral To Chaplain  While rounding [facility] waiting area, chaplain spoke with patient's wife and she patient was have a total knee replacement. Chaplain asked her she was doing and she said fine. Chaplain wished her well.

## 2019-10-02 NOTE — Anesthesia Procedure Notes (Signed)
Date/Time: 10/02/2019 8:00 AM Performed by: Junious Silk, CRNA Pre-anesthesia Checklist: Patient identified, Emergency Drugs available, Suction available, Patient being monitored and Timeout performed Oxygen Delivery Method: Simple face mask

## 2019-10-02 NOTE — Transfer of Care (Signed)
Immediate Anesthesia Transfer of Care Note  Patient: Darryl Payne  Procedure(s) Performed: COMPUTER ASSISTED TOTAL KNEE ARTHROPLASTY (Right Knee)  Patient Location: PACU  Anesthesia Type:Spinal  Level of Consciousness: awake, alert  and oriented  Airway & Oxygen Therapy: Patient Spontanous Breathing and Patient connected to face mask oxygen  Post-op Assessment: Report given to RN and Post -op Vital signs reviewed and stable  Post vital signs: Reviewed and stable  Last Vitals:  Vitals Value Taken Time  BP 126/77 10/02/19 1116  Temp 36.1 C 10/02/19 1115  Pulse 86 10/02/19 1117  Resp 15 10/02/19 1117  SpO2 96 % 10/02/19 1117  Vitals shown include unvalidated device data.  Last Pain:  Vitals:   10/02/19 0611  TempSrc: Tympanic  PainSc: 8          Complications: No complications documented.

## 2019-10-02 NOTE — Anesthesia Procedure Notes (Signed)
Spinal  Patient location during procedure: OR Start time: 10/02/2019 7:20 AM Staffing Performed: resident/CRNA  Resident/CRNA: Nelda Marseille, CRNA Preanesthetic Checklist Completed: patient identified, IV checked, site marked, risks and benefits discussed, surgical consent, monitors and equipment checked, pre-op evaluation and timeout performed Spinal Block Patient position: sitting Prep: Betadine Patient monitoring: heart rate, continuous pulse ox, blood pressure and cardiac monitor Approach: midline Location: L3-4 Injection technique: single-shot Needle Needle type: Whitacre and Introducer  Needle gauge: 25 G Needle length: 9 cm Assessment Sensory level: T10 Additional Notes Negative paresthesia. Negative blood return. Positive free-flowing CSF. Expiration date of kit checked and confirmed. Patient tolerated procedure well, without complications.

## 2019-10-02 NOTE — OR Nursing (Addendum)
PT in for consult 1355.

## 2019-10-02 NOTE — H&P (Signed)
The patient has been re-examined, and the chart reviewed, and there have been no interval changes to the documented history and physical.    The risks, benefits, and alternatives have been discussed at length. The patient expressed understanding of the risks benefits and agreed with plans for surgical intervention.  Darryl Payne P. [name], Jr. M.D.    

## 2019-10-02 NOTE — Evaluation (Addendum)
Physical Therapy Evaluation Patient Details Name: Darryl Payne MRN: [id number] DOB: Jul 21, 1960 Today's Date: 10/02/2019   History of Present Illness  Peniel Hass is a 59 y/o male who is POD #0 of a R TKA. PMH includes GERD, glenohumeral arthritis, HTN, obesity, and sleep apnea.  Clinical Impression  Pt received in bed with wife at bedside and agreeable to PT evaluation. Pt with sensation and active movement to RLE while in bed. Pt with good strength in BUE and LLE and able to perform active SLR on RLE. Mod I for bed mobility and pt stood to RW with CGA. Pt then ambulated 250 feet using RW, with CGA, demosntrating reciprocal gait pattern with no noted buckling or LOB. Pt reported dizziness at 6/10 prior to negotiating steps and required seated rest break for dizziness to subside. Pt then able to negotiate 4 steps, after demonstration, using single HR, step-to pattern, and CGA/SBA for safety. Pt able to turn 180 degrees to descend without LOB or R knee buckling. RLE active ROM: 0-91 degrees. Pt educated on use of polar care cryotherapy unit, skin protection, WB orders, car transfers, and education packet reviewed. Pt and wife acknowledged understanding and all questions answered by end of session. Pt cleared by PT standards.  This entire session was guided, instructed, and directly supervised by Elizabeth Palau, DPT.     Follow Up Recommendations Home health PT    Equipment Recommendations  None recommended by PT (pt has all equipment)    Recommendations for Other Services       Precautions / Restrictions Precautions Precautions: Fall Restrictions Weight Bearing Restrictions: Yes RLE Weight Bearing: Weight bearing as tolerated      Mobility  Bed Mobility Overal bed mobility: Modified Independent             General bed mobility comments: Use of bed rail and HOB slightly elevated however pt required no external physical assistance.  Transfers Overall transfer level: Needs  assistance Equipment used: Rolling walker (2 wheeled) Transfers: Sit to/from Stand Sit to Stand: Min guard;Supervision         General transfer comment: CGA for initial sit to stand transfer and pt able to progress to SBA for safety. CGA for eccentric control on descent. Pt with good hand and foot positioning during transfers.   Ambulation/Gait Ambulation/Gait assistance: Min guard Gait Distance (Feet): 250 Feet Assistive device: Rolling walker (2 wheeled) Gait Pattern/deviations: Step-through pattern Gait velocity: slightly decreased   General Gait Details: Pt ambulated 250 feet using RW with CGA for safety. Noted reciprocal gait pattern and heel toe progression on RLE. Noted slightly increased lateral sway throughout ambulation distance.  Stairs Stairs: Yes Stairs assistance: Min guard Stair Management: One rail Right;Step to pattern;Forwards Number of Stairs: 4 General stair comments: Pt demonstrated safe technique negotiating 4 steps with step-to pattern and use of single HR; noted no knee buckling or LOB  Wheelchair Mobility    Modified Rankin (Stroke Patients Only)       Balance Overall balance assessment: Mild deficits observed, not formally tested                                           Pertinent Vitals/Pain Pain Assessment: Faces Faces Pain Scale: Hurts a little bit Pain Location: R knee Pain Descriptors / Indicators: Operative site guarding;Discomfort Pain Intervention(s): Monitored during session;Repositioned;Ice applied    Home Living Family/patient  expects to be discharged to:: Private residence Living Arrangements: Spouse/significant other Available Help at Discharge: Family;Available 24 hours/day Type of Home: Apartment Home Access: Stairs to enter Entrance Stairs-Rails: Right Entrance Stairs-Number of Steps: 4 Home Layout: One level Home Equipment: Walker - 2 wheels;Cane - single point;Grab bars - tub/shower      Prior  Function Level of Independence: Independent with assistive device(s)         Comments: Pt reports use of SPC for ambulation and for transfers. Pt denies fall history.     Hand Dominance        Extremity/Trunk Assessment   Upper Extremity Assessment Upper Extremity Assessment: Overall WFL for tasks assessed (grossly 4+ to 5/5 bilaterally)    Lower Extremity Assessment Lower Extremity Assessment: Overall WFL for tasks assessed;Generalized weakness;RLE deficits/detail (LLE 4+ to 5/5 grossly) RLE Deficits / Details: Pt able to perform active SLR however ROM may have been limited due to bandaging and polar care wrap, which was under bandage. RLE: Unable to fully assess due to immobilization RLE Sensation: WNL RLE Coordination: WNL       Communication   Communication: No difficulties  Cognition Arousal/Alertness: Awake/alert Behavior During Therapy: WFL for tasks assessed/performed Overall Cognitive Status: Within Functional Limits for tasks assessed                                        General Comments      Exercises Total Joint Exercises Goniometric ROM: R knee active ROM: 0-91 degrees Other Exercises Other Exercises: pt educated on polar care, skin protection, car transfers, and HEP with exercise packet issued and reviewed Other Exercises: pt performed active AP, QS, heel slides in supine and sitting, hip ab/add, SAQ, and LAQ x 10 each; verbal cues for correct technique with good carryover   Assessment/Plan    PT Assessment Patient needs continued PT services  PT Problem List Decreased strength;Decreased range of motion;Decreased activity tolerance;Decreased balance;Decreased mobility;Pain       PT Treatment Interventions DME instruction;Gait training;Stair training;Functional mobility training;Therapeutic activities;Therapeutic exercise;Balance training;Patient/family education    PT Goals (Current goals can be found in the Care Plan section)   Acute Rehab PT Goals Patient Stated Goal: to go home PT Goal Formulation: With patient Time For Goal Achievement: 10/16/19 Potential to Achieve Goals: Good    Frequency BID   Barriers to discharge        Co-evaluation               AM-PAC PT "6 Clicks" Mobility  Outcome Measure Help needed turning from your back to your side while in a flat bed without using bedrails?: None Help needed moving from lying on your back to sitting on the side of a flat bed without using bedrails?: None Help needed moving to and from a bed to a chair (including a wheelchair)?: A Little Help needed standing up from a chair using your arms (e.g., wheelchair or bedside chair)?: A Little Help needed to walk in hospital room?: A Little Help needed climbing 3-5 steps with a railing? : A Little 6 Click Score: 20    End of Session Equipment Utilized During Treatment: Gait belt Activity Tolerance: Patient tolerated treatment well Patient left: in chair;with family/visitor present;with nursing/sitter in room Nurse Communication: Mobility status PT Visit Diagnosis: Unsteadiness on feet (R26.81);Muscle weakness (generalized) (M62.81);Other abnormalities of gait and mobility (R26.89);Dizziness and giddiness (R42);Pain Pain - Right/Left:  Right Pain - part of body: Knee    Time: 9798-9211 PT Time Calculation (min) (ACUTE ONLY): 45 min   Charges:              Frederich Chick, SPT  Frederich Chick 10/02/2019, 4:03 PM

## 2019-10-02 NOTE — TOC Initial Note (Signed)
Transition of Care [facility]) - Initial/Assessment Note    Patient Details  Name: Darryl Payne MRN: [id number] Date of Birth: 1960/12/11  [facility] of [facility]) CM/SW Contact:    Marina Goodell Phone Number: [phone] 10/02/2019, 1:02 PM  Clinical Narrative:                  Received request for bed side commode from [employer] RN, CSW reached out to [employer] at [employer] for delivery.        Patient Goals and CMS Choice        Expected Discharge Plan and Services           Expected Discharge Date: 10/02/19                                    Prior Living Arrangements/Services                       Activities of Daily Living      Permission Sought/Granted                  Emotional Assessment              Admission diagnosis:  PRIMARY OSTEOARTHRITIS OF RIGHT KNEE Patient Active Problem List   Diagnosis Date Noted  . S/P total knee arthroplasty, left 06/17/2018  . Chronic pain syndrome 02/10/2018  . Obesity (BMI 35.0-39.9 without comorbidity) 08/04/2016  . Primary osteoarthritis of right knee 08/04/2016  . S/P shoulder replacement, left 04/30/2016  . Glenohumeral arthritis, right 06/27/2014   PCP:  Clovis Riley, L.August Saucer, MD Pharmacy:   [facility] [phone] - [address] [place] - [address] [place] [address] Phone: [phone] Fax: [phone]     Social Determinants of Health (SDOH) Interventions    Readmission Risk Interventions Readmission Risk Prevention Plan 06/19/2018  Post Dischage Appt Complete  Medication Screening Complete  Transportation Screening Complete  Some recent data might be hidden

## 2019-10-02 NOTE — OR Nursing (Signed)
Dr. Ernest Pine in to see pt 15:55 - advises ok to d/c to home without 2nd ofirmev dose.

## 2019-10-02 NOTE — Progress Notes (Signed)
OT Cancellation Note  Patient Details Name: Darryl Payne MRN: [id number] DOB: 24-Mar-1960   Cancelled Treatment:    Reason Eval/Treat Not Completed: OT screened, no needs identified, will sign off. Per conversation c PT, pt near functional baseline. Pt and wife deny any concerns at this time. No skilled acute OT needs identified, will sign off at this time.  Kathie Dike, M.S. OTR/L  10/02/19, 3:05 PM  ascom [phone]

## 2021-03-07 DIAGNOSIS — I10 Essential (primary) hypertension: Secondary | ICD-10-CM | POA: Diagnosis not present

## 2021-03-07 DIAGNOSIS — Z125 Encounter for screening for malignant neoplasm of prostate: Secondary | ICD-10-CM | POA: Diagnosis not present

## 2021-03-07 DIAGNOSIS — Z0000 Encounter for general adult medical examination without abnormal findings: Secondary | ICD-10-CM | POA: Diagnosis not present

## 2021-03-07 DIAGNOSIS — M150 Primary generalized (osteo)arthritis: Secondary | ICD-10-CM | POA: Diagnosis not present

## 2021-03-07 DIAGNOSIS — E7800 Pure hypercholesterolemia, unspecified: Secondary | ICD-10-CM | POA: Diagnosis not present

## 2021-06-10 DIAGNOSIS — R1310 Dysphagia, unspecified: Secondary | ICD-10-CM | POA: Diagnosis not present

## 2021-06-10 DIAGNOSIS — R12 Heartburn: Secondary | ICD-10-CM | POA: Diagnosis not present

## 2021-06-19 DIAGNOSIS — H524 Presbyopia: Secondary | ICD-10-CM | POA: Diagnosis not present

## 2021-09-18 DIAGNOSIS — E7800 Pure hypercholesterolemia, unspecified: Secondary | ICD-10-CM | POA: Diagnosis not present

## 2021-09-18 DIAGNOSIS — I10 Essential (primary) hypertension: Secondary | ICD-10-CM | POA: Diagnosis not present

## 2021-09-18 DIAGNOSIS — K219 Gastro-esophageal reflux disease without esophagitis: Secondary | ICD-10-CM | POA: Diagnosis not present

## 2021-09-18 DIAGNOSIS — M150 Primary generalized (osteo)arthritis: Secondary | ICD-10-CM | POA: Diagnosis not present

## 2022-01-11 DIAGNOSIS — R112 Nausea with vomiting, unspecified: Secondary | ICD-10-CM | POA: Diagnosis not present

## 2022-01-11 DIAGNOSIS — K8000 Calculus of gallbladder with acute cholecystitis without obstruction: Secondary | ICD-10-CM | POA: Diagnosis not present

## 2022-01-11 DIAGNOSIS — N281 Cyst of kidney, acquired: Secondary | ICD-10-CM | POA: Diagnosis not present

## 2022-01-11 DIAGNOSIS — I10 Essential (primary) hypertension: Secondary | ICD-10-CM | POA: Diagnosis not present

## 2022-01-11 DIAGNOSIS — K82A1 Gangrene of gallbladder in cholecystitis: Secondary | ICD-10-CM | POA: Diagnosis not present

## 2022-01-11 DIAGNOSIS — R079 Chest pain, unspecified: Secondary | ICD-10-CM | POA: Diagnosis not present

## 2022-01-11 DIAGNOSIS — R109 Unspecified abdominal pain: Secondary | ICD-10-CM | POA: Diagnosis not present

## 2022-01-11 DIAGNOSIS — K219 Gastro-esophageal reflux disease without esophagitis: Secondary | ICD-10-CM | POA: Diagnosis not present

## 2022-01-11 DIAGNOSIS — K2950 Unspecified chronic gastritis without bleeding: Secondary | ICD-10-CM | POA: Diagnosis not present

## 2022-01-11 DIAGNOSIS — Z1152 Encounter for screening for COVID-19: Secondary | ICD-10-CM | POA: Diagnosis not present

## 2022-01-11 DIAGNOSIS — Z79899 Other long term (current) drug therapy: Secondary | ICD-10-CM | POA: Diagnosis not present

## 2022-01-11 DIAGNOSIS — K429 Umbilical hernia without obstruction or gangrene: Secondary | ICD-10-CM | POA: Diagnosis not present

## 2022-01-11 DIAGNOSIS — K2970 Gastritis, unspecified, without bleeding: Secondary | ICD-10-CM | POA: Diagnosis not present

## 2022-01-11 DIAGNOSIS — K8020 Calculus of gallbladder without cholecystitis without obstruction: Secondary | ICD-10-CM | POA: Diagnosis not present

## 2022-01-12 DIAGNOSIS — I361 Nonrheumatic tricuspid (valve) insufficiency: Secondary | ICD-10-CM | POA: Diagnosis not present

## 2022-01-12 DIAGNOSIS — N281 Cyst of kidney, acquired: Secondary | ICD-10-CM | POA: Diagnosis not present

## 2022-01-12 DIAGNOSIS — K219 Gastro-esophageal reflux disease without esophagitis: Secondary | ICD-10-CM | POA: Diagnosis not present

## 2022-01-12 DIAGNOSIS — R011 Cardiac murmur, unspecified: Secondary | ICD-10-CM | POA: Diagnosis not present

## 2022-01-12 DIAGNOSIS — I350 Nonrheumatic aortic (valve) stenosis: Secondary | ICD-10-CM | POA: Diagnosis not present

## 2022-01-12 DIAGNOSIS — R112 Nausea with vomiting, unspecified: Secondary | ICD-10-CM | POA: Diagnosis not present

## 2022-01-12 DIAGNOSIS — I517 Cardiomegaly: Secondary | ICD-10-CM | POA: Diagnosis not present

## 2022-01-12 DIAGNOSIS — R12 Heartburn: Secondary | ICD-10-CM | POA: Diagnosis not present

## 2022-01-12 DIAGNOSIS — K429 Umbilical hernia without obstruction or gangrene: Secondary | ICD-10-CM | POA: Diagnosis not present

## 2022-01-13 DIAGNOSIS — R12 Heartburn: Secondary | ICD-10-CM | POA: Diagnosis not present

## 2022-01-13 DIAGNOSIS — K219 Gastro-esophageal reflux disease without esophagitis: Secondary | ICD-10-CM | POA: Diagnosis not present

## 2022-01-13 DIAGNOSIS — K2950 Unspecified chronic gastritis without bleeding: Secondary | ICD-10-CM | POA: Diagnosis not present

## 2022-01-13 DIAGNOSIS — R112 Nausea with vomiting, unspecified: Secondary | ICD-10-CM | POA: Diagnosis not present

## 2022-01-13 DIAGNOSIS — K2970 Gastritis, unspecified, without bleeding: Secondary | ICD-10-CM | POA: Diagnosis not present

## 2022-01-14 DIAGNOSIS — K2970 Gastritis, unspecified, without bleeding: Secondary | ICD-10-CM | POA: Diagnosis not present

## 2022-01-14 DIAGNOSIS — K810 Acute cholecystitis: Secondary | ICD-10-CM | POA: Diagnosis not present

## 2022-01-14 DIAGNOSIS — K8020 Calculus of gallbladder without cholecystitis without obstruction: Secondary | ICD-10-CM | POA: Diagnosis not present

## 2022-01-14 DIAGNOSIS — K219 Gastro-esophageal reflux disease without esophagitis: Secondary | ICD-10-CM | POA: Diagnosis not present

## 2022-01-14 DIAGNOSIS — R109 Unspecified abdominal pain: Secondary | ICD-10-CM | POA: Diagnosis not present

## 2022-01-14 DIAGNOSIS — K8000 Calculus of gallbladder with acute cholecystitis without obstruction: Secondary | ICD-10-CM | POA: Diagnosis not present

## 2022-01-14 DIAGNOSIS — R1110 Vomiting, unspecified: Secondary | ICD-10-CM | POA: Diagnosis not present

## 2022-01-14 DIAGNOSIS — K82A1 Gangrene of gallbladder in cholecystitis: Secondary | ICD-10-CM | POA: Diagnosis not present

## 2022-01-14 DIAGNOSIS — R12 Heartburn: Secondary | ICD-10-CM | POA: Diagnosis not present

## 2022-01-14 DIAGNOSIS — R1011 Right upper quadrant pain: Secondary | ICD-10-CM | POA: Diagnosis not present

## 2022-01-14 DIAGNOSIS — R079 Chest pain, unspecified: Secondary | ICD-10-CM | POA: Diagnosis not present

## 2022-01-17 DIAGNOSIS — R1084 Generalized abdominal pain: Secondary | ICD-10-CM | POA: Diagnosis not present

## 2022-02-21 DIAGNOSIS — J101 Influenza due to other identified influenza virus with other respiratory manifestations: Secondary | ICD-10-CM | POA: Diagnosis not present

## 2022-02-21 DIAGNOSIS — R058 Other specified cough: Secondary | ICD-10-CM | POA: Diagnosis not present

## 2022-04-03 DIAGNOSIS — E7800 Pure hypercholesterolemia, unspecified: Secondary | ICD-10-CM | POA: Diagnosis not present

## 2022-04-03 DIAGNOSIS — Z125 Encounter for screening for malignant neoplasm of prostate: Secondary | ICD-10-CM | POA: Diagnosis not present

## 2022-04-03 DIAGNOSIS — I10 Essential (primary) hypertension: Secondary | ICD-10-CM | POA: Diagnosis not present

## 2022-04-03 DIAGNOSIS — Z0000 Encounter for general adult medical examination without abnormal findings: Secondary | ICD-10-CM | POA: Diagnosis not present

## 2022-07-13 DIAGNOSIS — K219 Gastro-esophageal reflux disease without esophagitis: Secondary | ICD-10-CM | POA: Diagnosis not present

## 2022-10-07 DIAGNOSIS — I10 Essential (primary) hypertension: Secondary | ICD-10-CM | POA: Diagnosis not present

## 2022-10-07 DIAGNOSIS — E7800 Pure hypercholesterolemia, unspecified: Secondary | ICD-10-CM | POA: Diagnosis not present

## 2022-10-07 DIAGNOSIS — K219 Gastro-esophageal reflux disease without esophagitis: Secondary | ICD-10-CM | POA: Diagnosis not present

## 2022-10-19 DIAGNOSIS — L249 Irritant contact dermatitis, unspecified cause: Secondary | ICD-10-CM | POA: Diagnosis not present

## 2023-01-07 DIAGNOSIS — K429 Umbilical hernia without obstruction or gangrene: Secondary | ICD-10-CM | POA: Diagnosis not present

## 2023-03-02 DIAGNOSIS — R350 Frequency of micturition: Secondary | ICD-10-CM | POA: Diagnosis not present

## 2023-03-02 DIAGNOSIS — N529 Male erectile dysfunction, unspecified: Secondary | ICD-10-CM | POA: Diagnosis not present

## 2023-03-02 DIAGNOSIS — F5221 Male erectile disorder: Secondary | ICD-10-CM | POA: Diagnosis not present

## 2023-03-10 DIAGNOSIS — R351 Nocturia: Secondary | ICD-10-CM | POA: Diagnosis not present

## 2023-03-10 DIAGNOSIS — R3915 Urgency of urination: Secondary | ICD-10-CM | POA: Diagnosis not present

## 2023-03-10 DIAGNOSIS — N401 Enlarged prostate with lower urinary tract symptoms: Secondary | ICD-10-CM | POA: Diagnosis not present

## 2023-03-10 DIAGNOSIS — R350 Frequency of micturition: Secondary | ICD-10-CM | POA: Diagnosis not present

## 2023-04-09 DIAGNOSIS — N401 Enlarged prostate with lower urinary tract symptoms: Secondary | ICD-10-CM | POA: Diagnosis not present

## 2023-04-09 DIAGNOSIS — I771 Stricture of artery: Secondary | ICD-10-CM | POA: Diagnosis not present

## 2023-04-09 DIAGNOSIS — N320 Bladder-neck obstruction: Secondary | ICD-10-CM | POA: Diagnosis not present

## 2023-05-05 DIAGNOSIS — L309 Dermatitis, unspecified: Secondary | ICD-10-CM | POA: Diagnosis not present

## 2023-05-05 DIAGNOSIS — Z0000 Encounter for general adult medical examination without abnormal findings: Secondary | ICD-10-CM | POA: Diagnosis not present

## 2023-05-05 DIAGNOSIS — I10 Essential (primary) hypertension: Secondary | ICD-10-CM | POA: Diagnosis not present

## 2023-07-07 DIAGNOSIS — R350 Frequency of micturition: Secondary | ICD-10-CM | POA: Diagnosis not present

## 2023-07-07 DIAGNOSIS — F5221 Male erectile disorder: Secondary | ICD-10-CM | POA: Diagnosis not present

## 2023-09-07 DIAGNOSIS — M25729 Osteophyte, unspecified elbow: Secondary | ICD-10-CM | POA: Diagnosis not present

## 2023-09-07 DIAGNOSIS — M778 Other enthesopathies, not elsewhere classified: Secondary | ICD-10-CM | POA: Diagnosis not present

## 2023-09-07 DIAGNOSIS — M25722 Osteophyte, left elbow: Secondary | ICD-10-CM | POA: Diagnosis not present

## 2023-09-07 DIAGNOSIS — M25522 Pain in left elbow: Secondary | ICD-10-CM | POA: Diagnosis not present

## 2023-10-20 DIAGNOSIS — J069 Acute upper respiratory infection, unspecified: Secondary | ICD-10-CM | POA: Diagnosis not present

## 2023-10-20 DIAGNOSIS — M791 Myalgia, unspecified site: Secondary | ICD-10-CM | POA: Diagnosis not present

## 2023-10-20 DIAGNOSIS — R6889 Other general symptoms and signs: Secondary | ICD-10-CM | POA: Diagnosis not present

## 2023-10-20 DIAGNOSIS — J029 Acute pharyngitis, unspecified: Secondary | ICD-10-CM | POA: Diagnosis not present

## 2023-10-20 DIAGNOSIS — J189 Pneumonia, unspecified organism: Secondary | ICD-10-CM | POA: Diagnosis not present

## 2023-10-20 DIAGNOSIS — R051 Acute cough: Secondary | ICD-10-CM | POA: Diagnosis not present

## 2023-11-02 DIAGNOSIS — K436 Other and unspecified ventral hernia with obstruction, without gangrene: Secondary | ICD-10-CM | POA: Diagnosis not present

## 2023-11-12 DIAGNOSIS — K436 Other and unspecified ventral hernia with obstruction, without gangrene: Secondary | ICD-10-CM | POA: Diagnosis not present

## 2023-11-12 DIAGNOSIS — I10 Essential (primary) hypertension: Secondary | ICD-10-CM | POA: Diagnosis not present

## 2023-11-12 DIAGNOSIS — G8918 Other acute postprocedural pain: Secondary | ICD-10-CM | POA: Diagnosis not present

## 2023-11-12 DIAGNOSIS — Z79899 Other long term (current) drug therapy: Secondary | ICD-10-CM | POA: Diagnosis not present

## 2023-11-12 DIAGNOSIS — Z6836 Body mass index (BMI) 36.0-36.9, adult: Secondary | ICD-10-CM | POA: Diagnosis not present

## 2023-11-12 DIAGNOSIS — K439 Ventral hernia without obstruction or gangrene: Secondary | ICD-10-CM | POA: Diagnosis not present

## 2023-11-12 DIAGNOSIS — Z7951 Long term (current) use of inhaled steroids: Secondary | ICD-10-CM | POA: Diagnosis not present

## 2023-11-12 DIAGNOSIS — Z885 Allergy status to narcotic agent status: Secondary | ICD-10-CM | POA: Diagnosis not present

## 2023-11-12 DIAGNOSIS — E66812 Obesity, class 2: Secondary | ICD-10-CM | POA: Diagnosis not present

## 2023-11-12 DIAGNOSIS — K219 Gastro-esophageal reflux disease without esophagitis: Secondary | ICD-10-CM | POA: Diagnosis not present

## 2023-11-12 DIAGNOSIS — Z87891 Personal history of nicotine dependence: Secondary | ICD-10-CM | POA: Diagnosis not present

## 2023-11-18 DIAGNOSIS — K436 Other and unspecified ventral hernia with obstruction, without gangrene: Secondary | ICD-10-CM | POA: Diagnosis not present

## 2023-11-25 DIAGNOSIS — K436 Other and unspecified ventral hernia with obstruction, without gangrene: Secondary | ICD-10-CM | POA: Diagnosis not present

## 2023-12-09 DIAGNOSIS — K436 Other and unspecified ventral hernia with obstruction, without gangrene: Secondary | ICD-10-CM | POA: Diagnosis not present

## 2023-12-24 DIAGNOSIS — E7800 Pure hypercholesterolemia, unspecified: Secondary | ICD-10-CM | POA: Diagnosis not present

## 2023-12-24 DIAGNOSIS — Z125 Encounter for screening for malignant neoplasm of prostate: Secondary | ICD-10-CM | POA: Diagnosis not present

## 2023-12-24 DIAGNOSIS — Z0000 Encounter for general adult medical examination without abnormal findings: Secondary | ICD-10-CM | POA: Diagnosis not present

## 2023-12-24 DIAGNOSIS — Z23 Encounter for immunization: Secondary | ICD-10-CM | POA: Diagnosis not present

## 2023-12-24 DIAGNOSIS — K219 Gastro-esophageal reflux disease without esophagitis: Secondary | ICD-10-CM | POA: Diagnosis not present

## 2023-12-24 DIAGNOSIS — N529 Male erectile dysfunction, unspecified: Secondary | ICD-10-CM | POA: Diagnosis not present

## 2023-12-24 DIAGNOSIS — I10 Essential (primary) hypertension: Secondary | ICD-10-CM | POA: Diagnosis not present

## 2023-12-28 ENCOUNTER — Other Ambulatory Visit (HOSPITAL_BASED_OUTPATIENT_CLINIC_OR_DEPARTMENT_OTHER): Payer: Self-pay | Admitting: Family Medicine

## 2023-12-28 DIAGNOSIS — E7800 Pure hypercholesterolemia, unspecified: Secondary | ICD-10-CM

## 2024-01-14 ENCOUNTER — Ambulatory Visit (HOSPITAL_BASED_OUTPATIENT_CLINIC_OR_DEPARTMENT_OTHER)
Admission: RE | Admit: 2024-01-14 | Discharge: 2024-01-14 | Disposition: A | Payer: Self-pay | Source: Ambulatory Visit | Attending: Family Medicine | Admitting: Family Medicine

## 2024-01-14 DIAGNOSIS — E7800 Pure hypercholesterolemia, unspecified: Secondary | ICD-10-CM
